# Patient Record
Sex: Female | Born: 1970
Health system: Southern US, Community
[De-identification: ages and names within clinical notes are randomized; demographics above are authoritative.]

## PROBLEM LIST (undated history)

## (undated) DIAGNOSIS — R112 Nausea with vomiting, unspecified: Secondary | ICD-10-CM

## (undated) DIAGNOSIS — K219 Gastro-esophageal reflux disease without esophagitis: Secondary | ICD-10-CM

## (undated) DIAGNOSIS — E785 Hyperlipidemia, unspecified: Secondary | ICD-10-CM

## (undated) DIAGNOSIS — Z9889 Other specified postprocedural states: Secondary | ICD-10-CM

## (undated) DIAGNOSIS — J309 Allergic rhinitis, unspecified: Secondary | ICD-10-CM

## (undated) HISTORY — DX: Hyperlipidemia, unspecified: E78.5

## (undated) HISTORY — PX: ABDOMINAL HYSTERECTOMY: SHX81

## (undated) HISTORY — DX: Gastro-esophageal reflux disease without esophagitis: K21.9

## (undated) HISTORY — DX: Allergic rhinitis, unspecified: J30.9

---

## 1993-05-18 HISTORY — PX: CYST EXCISION: SHX5701

## 2005-07-07 ENCOUNTER — Ambulatory Visit: Payer: Self-pay | Admitting: *Deleted

## 2005-07-16 ENCOUNTER — Ambulatory Visit: Payer: Self-pay | Admitting: *Deleted

## 2005-08-16 ENCOUNTER — Ambulatory Visit: Payer: Self-pay | Admitting: *Deleted

## 2007-12-14 ENCOUNTER — Ambulatory Visit: Payer: Self-pay

## 2011-05-27 ENCOUNTER — Ambulatory Visit: Payer: Self-pay | Admitting: Family Medicine

## 2013-09-28 ENCOUNTER — Telehealth: Payer: Self-pay | Admitting: Obstetrics and Gynecology

## 2013-09-28 NOTE — Telephone Encounter (Signed)
lmtcb to schedule a new patient doctor referral/[place]

## 2013-10-02 NOTE — Telephone Encounter (Signed)
I can see the patient at 10:30 on 5/20 for a new patient appointment.  I will be out of surgery at 10:30 and she can start her visit and abstracting with the nurse.

## 2013-10-02 NOTE — Telephone Encounter (Addendum)
Spoke with patient's spouse who wants to schedule the appointment but states his wife is in a lot of pain and hopes for a day sooner than 10/25/13 with Dr. Charlies Constable. This patient will probably need scheduled with Dr. Sabra Heck or Dr. Quincy Simmonds but no slots are available to offer. The patient's spouse wants [place] to see if there is anyone who can see her sooner. Please advise? This is an incoming referral for BTL and pelvic pain. I will put the records outside Dr. Elza Rafter door for review.  Cc:  Gay Filler Dr. Sabra Heck

## 2013-10-04 ENCOUNTER — Ambulatory Visit (INDEPENDENT_AMBULATORY_CARE_PROVIDER_SITE_OTHER): Payer: BC Managed Care – PPO | Admitting: Obstetrics and Gynecology

## 2013-10-04 ENCOUNTER — Encounter: Payer: Self-pay | Admitting: Obstetrics and Gynecology

## 2013-10-04 VITALS — BP 112/74 | HR 80 | Resp 16 | Ht 63.5 in | Wt 195.0 lb

## 2013-10-04 DIAGNOSIS — M25559 Pain in unspecified hip: Secondary | ICD-10-CM

## 2013-10-04 DIAGNOSIS — N920 Excessive and frequent menstruation with regular cycle: Secondary | ICD-10-CM

## 2013-10-04 DIAGNOSIS — R809 Proteinuria, unspecified: Secondary | ICD-10-CM

## 2013-10-04 DIAGNOSIS — N921 Excessive and frequent menstruation with irregular cycle: Secondary | ICD-10-CM

## 2013-10-04 HISTORY — PX: CHOLECYSTECTOMY: SHX55

## 2013-10-04 LAB — CBC WITH DIFFERENTIAL/PLATELET
BASOS PCT: 1 % (ref 0–1)
Basophils Absolute: 0.1 10*3/uL (ref 0.0–0.1)
EOS ABS: 0.2 10*3/uL (ref 0.0–0.7)
Eosinophils Relative: 2 % (ref 0–5)
HEMATOCRIT: 33.5 % — AB (ref 36.0–46.0)
HEMOGLOBIN: 11.4 g/dL — AB (ref 12.0–15.0)
LYMPHS ABS: 2.3 10*3/uL (ref 0.7–4.0)
Lymphocytes Relative: 27 % (ref 12–46)
MCH: 24.6 pg — AB (ref 26.0–34.0)
MCHC: 34 g/dL (ref 30.0–36.0)
MCV: 72.2 fL — AB (ref 78.0–100.0)
MONO ABS: 0.6 10*3/uL (ref 0.1–1.0)
MONOS PCT: 7 % (ref 3–12)
Neutro Abs: 5.4 10*3/uL (ref 1.7–7.7)
Neutrophils Relative %: 63 % (ref 43–77)
Platelets: 509 10*3/uL — ABNORMAL HIGH (ref 150–400)
RBC: 4.64 MIL/uL (ref 3.87–5.11)
RDW: 16.6 % — ABNORMAL HIGH (ref 11.5–15.5)
WBC: 8.5 10*3/uL (ref 4.0–10.5)

## 2013-10-04 LAB — POCT URINALYSIS DIPSTICK
Bilirubin, UA: NEGATIVE
Blood, UA: NEGATIVE
Glucose, UA: NEGATIVE
KETONES UA: NEGATIVE
Leukocytes, UA: NEGATIVE
Nitrite, UA: NEGATIVE
PROTEIN UA: 7
UROBILINOGEN UA: NEGATIVE
pH, UA: 5

## 2013-10-04 LAB — COMPREHENSIVE METABOLIC PANEL
ALK PHOS: 119 U/L — AB (ref 39–117)
ALT: 17 U/L (ref 0–35)
AST: 18 U/L (ref 0–37)
Albumin: 4.2 g/dL (ref 3.5–5.2)
BILIRUBIN TOTAL: 0.6 mg/dL (ref 0.2–1.2)
BUN: 11 mg/dL (ref 6–23)
CHLORIDE: 102 meq/L (ref 96–112)
CO2: 23 mEq/L (ref 19–32)
CREATININE: 0.62 mg/dL (ref 0.50–1.10)
Calcium: 9.4 mg/dL (ref 8.4–10.5)
Glucose, Bld: 90 mg/dL (ref 70–99)
Potassium: 4.2 mEq/L (ref 3.5–5.3)
Sodium: 137 mEq/L (ref 135–145)
Total Protein: 7.8 g/dL (ref 6.0–8.3)

## 2013-10-04 LAB — URINALYSIS, ROUTINE W REFLEX MICROSCOPIC
BILIRUBIN URINE: NEGATIVE
Glucose, UA: NEGATIVE mg/dL
Hgb urine dipstick: NEGATIVE
Ketones, ur: NEGATIVE mg/dL
LEUKOCYTES UA: NEGATIVE
NITRITE: NEGATIVE
PROTEIN: 30 mg/dL — AB
SPECIFIC GRAVITY, URINE: 1.015 (ref 1.005–1.030)
UROBILINOGEN UA: 0.2 mg/dL (ref 0.0–1.0)
pH: 5.5 (ref 5.0–8.0)

## 2013-10-04 LAB — HEMOGLOBIN, FINGERSTICK: HEMOGLOBIN, FINGERSTICK: 10.9 g/dL — AB (ref 12.0–16.0)

## 2013-10-04 LAB — TSH: TSH: 1.523 u[IU]/mL (ref 0.350–4.500)

## 2013-10-04 MED ORDER — TRAMADOL HCL 50 MG PO TABS: 50.0000 mg | ORAL_TABLET | Freq: Four times a day (QID) | ORAL | Status: DC | PRN

## 2013-10-04 NOTE — Progress Notes (Signed)
Patient ID: Brittany Kelly, female   DOB: July 24, 1970, 43 y.o.   MRN: [id number] GYNECOLOGY VISIT  PCP:  [facility] [place], [place])  Referring provider:  Delight Stare, MD  HPI: 43 y.o.   Married  Hispanic  female   G1P1001 with Patient's last menstrual period was 09/21/2013.   here for Pelvic pain/ heavy menstrual cycles and abnormal ultrasound. May want to discuss tubal.  Patient accompanied by husband and interpretor for history and accompanied by interpretor for physical exam.  Ultrasound on 12/16/12 showing uterus retroflexed 7.8 x 3.8 x 3.8 cm.  EMS 2.9 mm.  No focal myometrial masses.  LUS scar noted.   Right ovary with 3.0 x 1.3 x 2.5 cm ovoid mass which is adjacent to superior uterus and may be right ovary versus a pedunculated fibroid.  Left ovary 3.3 x 1.3 1.4 cm with small ovarian follicles. 2 mm hyperechoic focus within the left ovary.   Patient is having pain and pressure and heavy bleeding for 1 and 1/2 years.  Occurring in right the left lower abdomen.  Symptoms are increasing and having difficulty sleeping.  Monthly menses on birth control pills.  Tried ParaGard IUD but symptoms were worse, so she switched back to OCPs. After IUD removed, symptoms continued. Oral contraceptives have not helped and having nausea and headaches with them.   No evaluation other than ultrasound last year.   Pain worse with exercise.  Advil helps more or less.  Taking Advil 400 mg po bid and Meloxicam from husband.  Having heart burn.  Cramps and pressure start before menses and continue afterwards.  Changing a pad every 1.5 - 2 hours with her menses. Menses last 10 days and spots after this.   Declines future childbearing.  Had one Cesarean Section 18 years ago.   Had emergency laparotomy with gall bladder removal 17 years ago.   Little constipation, nothing important.  No dysuria.  No history of back injuries or spine problems.  Some pain when plays tennis.    Hgb:   10.9 Urine:  Protein 7, Ph 5  GYNECOLOGIC HISTORY: Patient's last menstrual period was 09/21/2013. Sexually active:  yes Partner preference: female Contraception:  OCP's--Aviane Menopausal hormone therapy: n/a DES exposure:  no  Blood transfusions:np   Sexually transmitted diseases:no    GYN procedures and prior surgeries:  C-Section. Last mammogram: 2013 P[facility]               Last pap and high risk HPV testing:  02/2013 wnl:?HPV testing.  History of abnormal pap smear:  no   OB History   Grav Para Term Preterm Abortions TAB SAB Ect Mult Living   1 1 1       1        LIFESTYLE: Exercise:  Tennis/gym             Tobacco:   no Alcohol:    4 glasses per week Drug use:  no  There are no active problems to display for this patient.   History reviewed. No pertinent past medical history.  Past Surgical History  Procedure Laterality Date  . Cyst excision Bilateral 1995    under both arms  . Cesarean section  1996  . Cholecystectomy      Current Outpatient Prescriptions  Medication Sig Dispense Refill  . fluticasone (FLONASE) 50 MCG/ACT nasal spray Place 1 spray into both nostrils daily.      Marland Kitchen ibuprofen (ADVIL,MOTRIN) 200 MG tablet Take 200 mg  by mouth every 6 (six) hours as needed.      Marland Kitchen levonorgestrel-ethinyl estradiol (AVIANE,ALESSE,LESSINA) 0.1-20 MG-MCG tablet Take 1 tablet by mouth daily.      . traMADol (ULTRAM) 50 MG tablet Take 1 tablet (50 mg total) by mouth every 6 (six) hours as needed.  15 tablet  0   No current facility-administered medications for this visit.     ALLERGIES: Review of patient's allergies indicates no known allergies.  Family History  Problem Relation Age of Onset  . Diabetes Mother   . Hypertension Mother   . Hypertension Father     History   Social History  . Marital Status: Married    Spouse Name: N/A    Number of Children: N/A  . Years of Education: N/A   Occupational History  . Not on  file.   Social History Main Topics  . Smoking status: Never Smoker   . Smokeless tobacco: Not on file  . Alcohol Use: 2.0 oz/week    4 drink(s) per week  . Drug Use: No  . Sexual Activity: Yes    Partners: Male    Birth Control/ Protection: OCP, Pill     Comment: Aviane 0.1/20   Other Topics Concern  . Not on file   Social History Narrative  . No narrative on file   SOC:  Married.  From [place].  ROS:  Pertinent items are noted in HPI.  PHYSICAL EXAMINATION:    BP 112/74  Pulse 80  Resp 16  Ht 5' 3.5" (1.613 m)  Wt 195 lb (88.451 kg)  BMI 34.00 kg/m2  LMP 09/21/2013   Wt Readings from Last 3 Encounters:  10/04/13 195 lb (88.451 kg)     Ht Readings from Last 3 Encounters:  10/04/13 5' 3.5" (1.613 m)    General appearance: alert, cooperative and appears stated age Head: Normocephalic, without obvious abnormality, atraumatic Neck: no adenopathy, supple, symmetrical, trachea midline and thyroid not enlarged, symmetric, no tenderness/mass/nodules Lungs: clear to auscultation bilaterally Heart: regular rate and rhythm Abdomen: RUQ oblique scar, pfannenstiel incision, soft, non-tender; no masses,  no organomegaly Extremities: extremities normal, atraumatic, no cyanosis or edema Skin: Skin color, texture, turgor normal. No rashes or lesions Lymph nodes: Cervical, supraclavicular, and axillary nodes normal. No abnormal inguinal nodes palpated Neurologic: Grossly normal  Pelvic: External genitalia:  no lesions              Urethra:  normal appearing urethra with no masses, tenderness or lesions              Bartholins and Skenes: normal                 Vagina: normal appearing vagina with normal color and discharge, no lesions              Cervix: normal appearance                 Bimanual Exam:  Uterus:  uterus is normal size, shape, consistency and nontender                                      Adnexa: normal adnexa in size, nontender and no masses  Rectovaginal: Confirms                                      Anus:  normal sphincter tone, no lesions  ASSESSMENT  Proteinuria.  Pelvic pain.  Menometrorrhagia. Declines future childbearing.   PLAN  Discussion regarding pelvic pain and menorrhagia and metrorrhagia in women.  Return for sonohysterogram and EMB. TSH, CBC with diff, CMP, urine GC/CT, urine micro and culture.  Tramadol 50 mg po q 6 hours prn.  #15.  RF zero.  Discussion of surgical intervention which may or may not include hysterectomy.  Written information about hysterectomy to patient in Spanish.   45 minutes faceto face time of which over 50^% was spent in counseling.    An After Visit Summary was printed and given to the patient.

## 2013-10-04 NOTE — Progress Notes (Signed)
Upon checkout, patient requested RX for pain.  Per Der Quincy Simmonds, will send small amount of non-narcotic medication for pain to pharm until further evaluation with PUS can be completed.  Information relayed to patient with translator.  Scheduled PUS/SHGM for 10-12-13 at 1200.  Notified on cancellation policy via translator.

## 2013-10-04 NOTE — Patient Instructions (Signed)
Informacin sobre Training and development officer (Hysterectomy Information)  La histerectoma es una ciruga que se realiza para extirpar el tero. Esta ciruga se puede realizar para tratar varios problemas mdicos. Despus de la ciruga, no volver a Personal assistant. Adems, debido a esta ciruga no podr quedar embarazada (estril). Asimismo, durante esta ciruga se podrn extirpar las trompas de falopio y los ovarios (salpingooforectoma bilateral).  RAZONES PARA REALIZAR UNA HISTERECTOMA  Sangrado persistente, anormal.  Dolor o infeccin persistente (crnico) en la pelvis.  El endometrio comienza a desarrollarse fuera del tero (endometriosis).  El endometrio comienza a desarrollarse en el msculo del tero (adenomiosis).  El tero desciende hacia la vagina (prolapso de los rganos de la pelvis).  Neoplasias benignas en el tero (fibromas uterinos) que provocan sntomas.  Clulas precancerosas.  Cncer cervical o cncer uterino. TIPOS DE HISTERECTOMA  Histerectoma supracervical: en este tipo de intervencin, se extirpa la parte superior del tero, pero no el cuello del tero.  Histerectoma total: se extirpan el tero y el cuello del tero.  Histerectoma radical: se extirpan el tero, el cuello del tero y los tejidos fibrosos que sostienen al tero en su lugar en la pelvis (parametrio). FORMAS EN QUE SE PUEDE REALIZAR UNA HISTERECTOMA  Histerectoma abdominal: se realiza un corte quirrgico grande (incisin) en el abdomen. Se extirpa el tero a travs de esta incisin.  Histerectoma vaginal: se realiza una incisin en la vagina. Se extirpa el tero a travs de esta incisin. No hay incisiones abdominales.  Histerectoma laparoscpica convencional: se realizan tres o cuatro incisiones pequeas en el abdomen. Se inserta un tubo delgado y luminoso con una cmara (laparoscopio) en una de las incisiones. A travs del resto de las incisiones se insertan otros instrumentos  quirrgicos. El tero se corta en trozos pequeos. Las piezas pequeas se eliminan a travs de las incisiones, o se retiran a travs de la vagina.  Histerectoma vaginal asistida por laparoscopia (HVAL): se realizan tres o cuatro incisiones pequeas en el abdomen. Parte de la ciruga se realiza por va laparoscpica y parte por va vaginal. El tero se extirpa a travs de la vagina.  Histerectoma laparoscpica asistida por robot: se insertan un laparoscopio y otros instrumentos quirrgicos en 3 o 4 incisiones pequeas en el abdomen. Se utiliza un dispositivo controlado por computadora para que el cirujano vea una imagen en 3D que lo ayuda a Chief Technology Officer los instrumentos quirrgicos. Esto permite movimientos ms precisos de los instrumentos quirrgicos. El tero se corta en trozos pequeos y se retira a travs de las incisiones o se elimina a travs de la vagina. RIESGOS Y Dunlap complicaciones asociadas con este procedimiento incluyen las siguientes:  Hemorragia y riesgos de Designer, industrial/product una transfusin sangunea. Infrmele al mdico si no quiere recibir ningn tipo de hemoderivados.  Cogulos de [employer] piernas o los pulmones.  Infeccin.  Lesin en los rganos circundantes.  Problemas o efectos secundarios relacionados con la anestesia.  Transformacin de cualquiera de las otras tcnicas en una histerectoma abdominal. QU ESPERAR DESPUES DE UNA HISTERECTOMA  Le darn medicamentos para el dolor.  Necesitar que alguien Nature conservation officer con usted durante los primeros 3 a 5 das despus de que regrese a Medical illustrator.  Tendr que ver al cirujano para un seguimiento 2 a 4 semanas despus de la ciruga para evaluar su progreso.  Posiblemente tenga sntomas tempranos de menopausia, como sofocos, sudoracin nocturna e insomnio.  Si le han realizado una histerectoma debido a un problema que no era cncer ni Ardelia Mems  afeccin que poda causar cncer, ya no necesitar una prueba de  Papanicolaou. Sin embargo, si ya no necesita hacerse un Papanicolau, es una buena idea hacerse un examen regularmente para asegurarse de que no hay otros problemas. Document Released: 05/04/2005 Document Revised: 02/22/2013 ExitCare Patient Information 2014 [employer].  

## 2013-10-05 DIAGNOSIS — R809 Proteinuria, unspecified: Secondary | ICD-10-CM | POA: Insufficient documentation

## 2013-10-05 DIAGNOSIS — N920 Excessive and frequent menstruation with regular cycle: Secondary | ICD-10-CM | POA: Insufficient documentation

## 2013-10-05 DIAGNOSIS — N921 Excessive and frequent menstruation with irregular cycle: Secondary | ICD-10-CM | POA: Insufficient documentation

## 2013-10-05 DIAGNOSIS — M25559 Pain in unspecified hip: Secondary | ICD-10-CM | POA: Insufficient documentation

## 2013-10-05 LAB — URINALYSIS, MICROSCOPIC ONLY
BACTERIA UA: NONE SEEN
CASTS: NONE SEEN
Crystals: NONE SEEN

## 2013-10-05 LAB — GC/CHLAMYDIA PROBE AMP, URINE
Chlamydia, Swab/Urine, PCR: NEGATIVE
GC Probe Amp, Urine: NEGATIVE

## 2013-10-06 ENCOUNTER — Telehealth: Payer: Self-pay | Admitting: Emergency Medicine

## 2013-10-06 LAB — CULTURE, URINE COMPREHENSIVE

## 2013-10-06 MED ORDER — TRAMADOL HCL 50 MG PO TABS: 50.0000 mg | ORAL_TABLET | Freq: Four times a day (QID) | ORAL | Status: DC | PRN

## 2013-10-06 NOTE — Telephone Encounter (Signed)
Husband of patient calling in and states patient has not received rx Tramadol. Advised husband that rx has been called into [place] [facility] Pharmacy: [facility] [place], [place].  He states it was not supposed to go to there and requests rx be sent to [facility] [address]. I called [facility] and cancelled rx at [facility]. Rx Escribed to [facility] and called and left message for rx on message for [facility].

## 2013-10-06 NOTE — Addendum Note (Signed)
Addended by: Michele Mcalpine on: 10/06/2013 11:58 AM   Modules accepted: Orders, Medications

## 2013-10-06 NOTE — Telephone Encounter (Signed)
Thank you for your assistance.  I will close the encounter.  

## 2013-10-12 ENCOUNTER — Other Ambulatory Visit: Payer: Self-pay | Admitting: Obstetrics and Gynecology

## 2013-10-12 ENCOUNTER — Other Ambulatory Visit: Payer: Self-pay

## 2013-10-12 ENCOUNTER — Ambulatory Visit (INDEPENDENT_AMBULATORY_CARE_PROVIDER_SITE_OTHER): Payer: BC Managed Care – PPO | Admitting: Obstetrics and Gynecology

## 2013-10-12 ENCOUNTER — Encounter: Payer: Self-pay | Admitting: Obstetrics and Gynecology

## 2013-10-12 ENCOUNTER — Ambulatory Visit (INDEPENDENT_AMBULATORY_CARE_PROVIDER_SITE_OTHER): Payer: BC Managed Care – PPO

## 2013-10-12 ENCOUNTER — Telehealth: Payer: Self-pay | Admitting: Obstetrics and Gynecology

## 2013-10-12 VITALS — BP 130/82 | HR 70 | Ht 63.5 in | Wt 194.0 lb

## 2013-10-12 DIAGNOSIS — N920 Excessive and frequent menstruation with regular cycle: Secondary | ICD-10-CM

## 2013-10-12 DIAGNOSIS — M25559 Pain in unspecified hip: Secondary | ICD-10-CM

## 2013-10-12 DIAGNOSIS — N921 Excessive and frequent menstruation with irregular cycle: Secondary | ICD-10-CM

## 2013-10-12 DIAGNOSIS — Z1231 Encounter for screening mammogram for malignant neoplasm of breast: Secondary | ICD-10-CM

## 2013-10-12 NOTE — Telephone Encounter (Signed)
Dr Quincy Simmonds would like for pt to come in for aex on next week.

## 2013-10-12 NOTE — Progress Notes (Signed)
Subjective  Patient is here for sonohysterogram for menorrhagia and pelvic pain.  Has interpretor present for the entire visit today.   Taking Tramadol prn.   Has completed childbearing and wishes for hysterectomy.   Objective  See ultrasound below  Uterus with fibroids, the largest 3.5 cm. Cesarean Section scar noted.  EMS 5.67 mm.  Ovaries normal.  Right ovary with 15 mm follicle.      Procedure - sonohysterogram Consent performed. Speculum placed in vagina. Sterile prep of cervix with betadine. Cannula placed inside endometrial cavity without difficult. Speculum removed. Sterile saline injected.     No         filling defect noted. Cannula removed. No complication.   Procedure - endometrial biopsy Consent performed. Speculum place in vagina.  Sterile prep of cervix with betadine.  Pipelle placed to    7      cm without difficulty twice. Tissue obtained and sent to pathology. Speculum removed.  No complications.  Assessment  Menorrhagia.  Fibroids.  Pelvic pain.  History of Cesarean Section.  Proteinuria.   Plan  Instructions and precautions to patient following procedure.  Follow up on EMB result.  Needs to return for routine annual exam. Repeat urinalysis at annual exam.  Will schedule mammogram.  I had a comprehensive discussion with the patient through the interpretor regarding options for care for pain and bleeding including hormonal contraception, laparoscopy with hysteroscopy and endometrial ablation, uterine artery embolization (to treat bleeding only.), and robotic total laparoscopic hysterectomy with possible bilateral salpingo-oophorectomy and cystoscopy. Risks of hysterectomy and BSO include but are not limited to bleeding, infection, damage to surrounding organs, DVT, PE, death, pneumonia, reaction to anesthesia, conversion to an open procedure, menopausal symptoms, and continued pain.  I discussed recovery expectations.   Patient would like to  proceed.  Will precert.   30 minutes face to face time of which over 50% was spent in counseling.   After visit summary to patient.

## 2013-10-12 NOTE — Progress Notes (Signed)
Patient is scheduled for bilateral screening mammogram at the [facility] on 10/24/13 at 1400. Patient agreeable to time/date/location, given printed confirmation. Advised will need spanish language interpretor for appointment.

## 2013-10-12 NOTE — Telephone Encounter (Signed)
Call from pt's husband. He accepted appt for 10-18-13 at 1 pm for pt.

## 2013-10-12 NOTE — Telephone Encounter (Signed)
Attempt to call pt. Phone just rings and rings. No answer and no VM.   (Was going to offer pt appt 10-18-13 at 1 pm for Aex with BS.)

## 2013-10-13 NOTE — Addendum Note (Signed)
Addended by: Tacy Learn, Kyrstan Gotwalt E on: 10/13/2013 10:12 AM   Modules accepted: Orders

## 2013-10-16 ENCOUNTER — Telehealth: Payer: Self-pay | Admitting: Obstetrics and Gynecology

## 2013-10-16 NOTE — Telephone Encounter (Signed)
Attempt to call pt. Phone just rings and rings. No answer and no VM. Need to go over surgery benefits.

## 2013-10-17 LAB — IPS OTHER TISSUE BIOPSY

## 2013-10-18 ENCOUNTER — Encounter: Payer: Self-pay | Admitting: Obstetrics and Gynecology

## 2013-10-18 ENCOUNTER — Ambulatory Visit (INDEPENDENT_AMBULATORY_CARE_PROVIDER_SITE_OTHER): Payer: BC Managed Care – PPO | Admitting: Obstetrics and Gynecology

## 2013-10-18 VITALS — BP 100/70 | HR 76 | Ht 63.5 in | Wt 195.0 lb

## 2013-10-18 DIAGNOSIS — Z01419 Encounter for gynecological examination (general) (routine) without abnormal findings: Secondary | ICD-10-CM

## 2013-10-18 DIAGNOSIS — Z0000 Encounter for general adult medical examination without abnormal findings: Secondary | ICD-10-CM

## 2013-10-18 LAB — POCT URINALYSIS DIPSTICK
Bilirubin, UA: NEGATIVE
GLUCOSE UA: NEGATIVE
Ketones, UA: NEGATIVE
LEUKOCYTES UA: NEGATIVE
Nitrite, UA: NEGATIVE
UROBILINOGEN UA: NEGATIVE
pH, UA: 5

## 2013-10-18 MED ORDER — LEVONORGESTREL-ETHINYL ESTRAD 0.1-20 MG-MCG PO TABS: 1.0000 | ORAL_TABLET | Freq: Every day | ORAL | Status: DC

## 2013-10-18 NOTE — Patient Instructions (Signed)

## 2013-10-18 NOTE — Progress Notes (Signed)
Patient ID: Brittany Kelly, female   DOB: 06/17/1970, 43 y.o.   MRN: [id number] GYNECOLOGY VISIT  PCP:   Delight Stare, MD  Referring provider:   HPI: 43 y.o.   Married  Hispanic  female   G1P1001 with Patient's last menstrual period was 10/14/2013.   here for   AEX. Having menorrhagia and pelvic pain.  Patient wants robotic hysterectomy.  Watched the DVD. EMB.on 10/13/13 - disordered proliferative endometrium with focal secretory type change - negative for atypia and malignancy. Ultrasound on 10/12/13 showed fibroids and normal ovaries.   Is having vaginal bleeding but it is less. Not taking birth control pills.  Stopped on May 30th.  Ran out.   Hgb:    11.4 on 10-04-13 Urine:   Mod. RBC's (see LMP), Trace Protein  GYNECOLOGIC HISTORY: Patient's last menstrual period was 10/14/2013. Sexually active:  yes Partner preference: female Contraception:   OCP's--Aviane Menopausal hormone therapy: n/a DES exposure:  no  Blood transfusions:  no Sexually transmitted diseases:   no GYN procedures and prior surgeries:  C-sectioon Last mammogram:  2013 wnl: [facility].  Has appointment for next week.              Last pap and high risk HPV testing: 02/2013 wnl: ?HPV testing. History of abnormal pap smear:  no   OB History   Grav Para Term Preterm Abortions TAB SAB Ect Mult Living   1 1 1       1        LIFESTYLE: Exercise:    Tennis/gym           Tobacco:    no Alcohol:      4 glasses per week Drug use:   no  OTHER HEALTH MAINTENANCE: Tetanus/TDap:  Up to date with PCP Gardisil:               n/a Influenza:             never Zostavax:              n/a  Bone density:        n/a Colonoscopy:        n/a  Cholesterol check:   2014 wnl  Family History  Problem Relation Age of Onset  . Diabetes Mother   . Hypertension Mother   . Hypertension Father     Patient Active Problem List   Diagnosis Date Noted  . Proteinuria 10/05/2013  . Menorrhagia 10/05/2013  .  Metrorrhagia 10/05/2013  . Pain in joint, pelvic region and thigh 10/05/2013   No past medical history on file.  Past Surgical History  Procedure Laterality Date  . Cyst excision Bilateral 1995    under both arms  . Cesarean section  1996  . Cholecystectomy      ALLERGIES: Review of patient's allergies indicates no known allergies.  Current Outpatient Prescriptions  Medication Sig Dispense Refill  . fluticasone (FLONASE) 50 MCG/ACT nasal spray Place 1 spray into both nostrils daily.      Marland Kitchen ibuprofen (ADVIL,MOTRIN) 200 MG tablet Take 200 mg by mouth every 6 (six) hours as needed.      Marland Kitchen levonorgestrel-ethinyl estradiol (AVIANE,ALESSE,LESSINA) 0.1-20 MG-MCG tablet Take 1 tablet by mouth daily.      . traMADol (ULTRAM) 50 MG tablet Take 1 tablet (50 mg total) by mouth every 6 (six) hours as needed.  15 tablet  0   No current facility-administered medications for this visit.     ROS:  Pertinent  items are noted in HPI.  SOCIAL HISTORY:  Married.  From [place].   PHYSICAL EXAMINATION:    BP 100/70  Pulse 76  Ht 5' 3.5" (1.613 m)  Wt 195 lb (88.451 kg)  BMI 34.00 kg/m2  LMP 10/14/2013   Wt Readings from Last 3 Encounters:  10/18/13 195 lb (88.451 kg)  10/12/13 194 lb (87.998 kg)  10/04/13 195 lb (88.451 kg)     Ht Readings from Last 3 Encounters:  10/18/13 5' 3.5" (1.613 m)  10/12/13 5' 3.5" (1.613 m)  10/04/13 5' 3.5" (1.613 m)    General appearance: alert, cooperative and appears stated age Head: Normocephalic, without obvious abnormality, atraumatic Neck: no adenopathy, supple, symmetrical, trachea midline and thyroid not enlarged, symmetric, no tenderness/mass/nodules Lungs: clear to auscultation bilaterally Breasts: Inspection negative, No nipple retraction or dimpling, No nipple discharge or bleeding, No axillary or supraclavicular adenopathy, Normal to palpation without dominant masses Heart: regular rate and rhythm Abdomen: soft, non-tender; no masses,  no  organomegaly Extremities: extremities normal, atraumatic, no cyanosis or edema Skin: Skin color, texture, turgor normal. No rashes or lesions Lymph nodes: Cervical, supraclavicular, and axillary nodes normal. No abnormal inguinal nodes palpated Neurologic: Grossly normal  Pelvic: External genitalia:  no lesions              Urethra:  normal appearing urethra with no masses, tenderness or lesions              Bartholins and Skenes: normal                 Vagina: normal appearing vagina with normal color and discharge, no lesions              Cervix: normal appearance.  Vaginal blood noted.               Pap and high risk HPV testing done: yes.            Bimanual Exam:  Uterus:  uterus is normal size, shape, consistency and nontender                                      Adnexa: normal adnexa in size, nontender and no masses                                      Rectovaginal: Confirms                                      Anus:  normal sphincter tone, no lesions  ASSESSMENT  Normal gynecologic exam. Menorrhagia.  Pelvic pain.   PLAN  Mammogram recommended yearly.  Pap smear and high risk HPV testing performed. Continue with LoEstrin - restart today.  Rx for 3 months.   See orders.  Counseled on self breast exam. Proceed with robotic total laparoscopic hysterectomy with BSO.  Return annually or prn   An After Visit Summary was printed and given to the patient.

## 2013-10-20 LAB — IPS PAP TEST WITH HPV

## 2013-10-23 NOTE — Telephone Encounter (Signed)
Release on file to speak with spouse. Advised spouse that per benefit quote received, patient would be responsible for $2881.36 for surgeons portion of surgery. Explained that facility will bill separately and will them directly. They are ready to schedule surgery. Prefers 07/07. They will wait to hear from nurse regarding scheduling.

## 2013-10-23 NOTE — Telephone Encounter (Signed)
Case request for 11-20-13 sent to hospital.

## 2013-10-24 ENCOUNTER — Ambulatory Visit
Admission: RE | Admit: 2013-10-24 | Discharge: 2013-10-24 | Disposition: A | Discharge status: Home / Self Care | Payer: BC Managed Care – PPO | Source: Ambulatory Visit

## 2013-10-24 DIAGNOSIS — Z1231 Encounter for screening mammogram for malignant neoplasm of breast: Secondary | ICD-10-CM

## 2013-10-26 NOTE — Telephone Encounter (Signed)
Spouse called to confirm surgery scheduled date. Advised that per Gay Filler, surgery is scheduled for 07.07.2015, but the time is still being figured out. She will contact patient with all details once finalized. Payment made of $2881.36 for surgeons fees. Mailed receipt.

## 2013-11-02 NOTE — Telephone Encounter (Signed)
I have reviewed the notes regarding the patient's instructions.  I will close the encounter.

## 2013-11-02 NOTE — Telephone Encounter (Signed)
Spoke with Marchia Bond patients husband (DPR signed). Surgery information & time given. He is aware the hospital will call & give them the time they need to be at the hospital. Told only clear fluids the day before the surgery & nothing including water after midnight. No jewelry or piercings on patient also. Instructions of what medication not to take as well as the bowel prep that is needed. Husband states he understands all instructions. Pre-op appt scheduled for Monday 11-06-13 at 12pm. Instead of mailing surgical information form,it can be given to pt at appt on Monday. Husband aware that pt will need to schedule a post op appt while they are here for the pre-op.  Close encounter when complete

## 2013-11-06 ENCOUNTER — Ambulatory Visit (INDEPENDENT_AMBULATORY_CARE_PROVIDER_SITE_OTHER): Payer: BC Managed Care – PPO | Admitting: Obstetrics and Gynecology

## 2013-11-06 ENCOUNTER — Encounter: Payer: Self-pay | Admitting: Obstetrics and Gynecology

## 2013-11-06 VITALS — BP 136/80 | HR 76 | Resp 18 | Ht 63.5 in | Wt 196.0 lb

## 2013-11-06 DIAGNOSIS — N921 Excessive and frequent menstruation with irregular cycle: Secondary | ICD-10-CM

## 2013-11-06 DIAGNOSIS — M25559 Pain in unspecified hip: Secondary | ICD-10-CM

## 2013-11-06 DIAGNOSIS — N920 Excessive and frequent menstruation with regular cycle: Secondary | ICD-10-CM

## 2013-11-06 NOTE — Progress Notes (Signed)
GYNECOLOGY VISIT  PCP:    Referring provider:   HPI: 43 y.o.   Married  Hispanic  female   G1P1001 with Patient's last menstrual period was 10/14/2013.   here for surgical consultation. Husband present for the discussion portion of the visit - history and surgical plan discussion.   Having menorrhagia and pelvic pain.  Wishes to proceed with robotic total laparoscopic hysterectomy with bilateral salpingo-oophorectomy.  Declines future childbearing and would like to take estrogen therapy post op.  EMB.on 10/13/13 - disordered proliferative endometrium with focal secretory type change - negative for atypia and malignancy.  Ultrasound on 10/12/13 showed fibroids and normal ovaries.   Currently on OCPs which have not significantly helped her pain or bleeding.   Headache for 3 days.  Some fever and sinus congestion? History of sinusitis but does not feel this is her usual sinusitis presentation.   GYNECOLOGIC HISTORY: Patient's last menstrual period was 10/14/2013. Sexually active: yes  Partner preference: female  Contraception: OCP's--Aviane  Menopausal hormone therapy: n/a  DES exposure: no  Blood transfusions: no  Sexually transmitted diseases: no  GYN procedures and prior surgeries: C-sectioon  Last mammogram: 6/0/15 - BI-RADS 1.  Last pap and high risk HPV testing: 10/18/13- no intraepithelial lesion or malignancy, negative HR HPV. History of abnormal pap smear: no    OB History   Grav Para Term Preterm Abortions TAB SAB Ect Mult Living   1 1 1       1        Patient Active Problem List   Diagnosis Date Noted  . Proteinuria 10/05/2013  . Menorrhagia 10/05/2013  . Metrorrhagia 10/05/2013  . Pain in joint, pelvic region and thigh 10/05/2013    History reviewed. No pertinent past medical history.  Past Surgical History  Procedure Laterality Date  . Cyst excision Bilateral 1995    under both arms  . Cesarean section  1996  . Cholecystectomy      Current Outpatient  Prescriptions  Medication Sig Dispense Refill  . ibuprofen (ADVIL,MOTRIN) 200 MG tablet Take 200 mg by mouth every 6 (six) hours as needed.      Marland Kitchen levonorgestrel-ethinyl estradiol (AVIANE,ALESSE,LESSINA) 0.1-20 MG-MCG tablet Take 1 tablet by mouth daily.  1 Package  2  . fluticasone (FLONASE) 50 MCG/ACT nasal spray Place 1 spray into both nostrils daily.      . traMADol (ULTRAM) 50 MG tablet Take 1 tablet (50 mg total) by mouth every 6 (six) hours as needed.  15 tablet  0   No current facility-administered medications for this visit.     ALLERGIES: Review of patient's allergies indicates no known allergies.  Family History  Problem Relation Age of Onset  . Diabetes Mother   . Hypertension Mother   . Hypertension Father     History   Social History  . Marital Status: Married    Spouse Name: N/A    Number of Children: N/A  . Years of Education: N/A   Occupational History  . Not on file.   Social History Main Topics  . Smoking status: Never Smoker   . Smokeless tobacco: Not on file  . Alcohol Use: 2.0 oz/week    4 drink(s) per week  . Drug Use: No  . Sexual Activity: Yes    Partners: Male    Birth Control/ Protection: OCP, Pill     Comment: Aviane 0.1/20   Other Topics Concern  . Not on file   Social History Narrative  . No narrative on  file    ROS:  Pertinent items are noted in HPI.  PHYSICAL EXAMINATION:    BP 136/80  Pulse 76  Resp 18  Ht 5' 3.5" (1.613 m)  Wt 196 lb (88.905 kg)  BMI 34.17 kg/m2  LMP 10/14/2013   Wt Readings from Last 3 Encounters:  11/06/13 196 lb (88.905 kg)  10/18/13 195 lb (88.451 kg)  10/12/13 194 lb (87.998 kg)     Ht Readings from Last 3 Encounters:  11/06/13 5' 3.5" (1.613 m)  10/18/13 5' 3.5" (1.613 m)  10/12/13 5' 3.5" (1.613 m)    General appearance: alert, cooperative and appears stated age Head: Normocephalic, without obvious abnormality, atraumatic, minimal sinus tenderness.  Neck: no adenopathy, supple,  symmetrical, trachea midline and thyroid not enlarged, symmetric, no tenderness/mass/nodules Lungs: clear to auscultation bilaterally Heart: regular rate and rhythm Abdomen: right oblique scar and pfannenstiel incisions, soft, non-tender; no masses,  no organomegaly Extremities: extremities normal, atraumatic, no cyanosis or edema Skin: Skin color, texture, turgor normal. No rashes or lesions Lymph nodes: Cervical, supraclavicular, and axillary nodes normal. No abnormal inguinal nodes palpated Neurologic: Grossly normal  Pelvic: External genitalia:  no lesions              Urethra:  normal appearing urethra with no masses, tenderness or lesions              Bartholins and Skenes: normal                 Vagina: normal appearing vagina with normal color and discharge, no lesions              Cervix: normal appearance                 Bimanual Exam:  Uterus:  uterus is normal size, shape, consistency and nontender                                      Adnexa: normal adnexa in size, nontender and no masses                               ASSESSMENT  Menorrhagia.  Fibroids.  Pelvic pain.  History of Cesarean Section  PLAN  Proceed with robotic total laparoscopic hysterectomy with bilateral salpingo-oophorectomy and anticipated lysis of adhesions.  Risks, benefits and alternatives reviewed with the patient who wishes to proceed.  Patient understands that hysterectomy is not a guarantee she will have resolution of her pain. Patient accepts estrogen therapy post op.  Patient understands possible risk of conversion to laparotomy for her surgery.  An After Visit Summary was printed and given to the patient.  25 minutes face to face time of which over 50% was spent in counseling.

## 2013-11-06 NOTE — Progress Notes (Signed)
Spoke to patient and husband after visit with Dr Quincy Simmonds. Surgery instruction sheet reviewed and given to patient, copy on chart. Post op appointments scheduled.

## 2013-11-14 ENCOUNTER — Encounter (HOSPITAL_COMMUNITY): Payer: Self-pay

## 2013-11-14 ENCOUNTER — Encounter (HOSPITAL_COMMUNITY)
Admission: RE | Admit: 2013-11-14 | Discharge: 2013-11-14 | Disposition: A | Discharge status: Home / Self Care | Payer: BC Managed Care – PPO | Source: Ambulatory Visit | Attending: Obstetrics and Gynecology | Admitting: Obstetrics and Gynecology

## 2013-11-14 DIAGNOSIS — Z01812 Encounter for preprocedural laboratory examination: Secondary | ICD-10-CM | POA: Insufficient documentation

## 2013-11-14 HISTORY — DX: Nausea with vomiting, unspecified: R11.2

## 2013-11-14 HISTORY — DX: Nausea with vomiting, unspecified: Z98.890

## 2013-11-14 LAB — CBC
HCT: 37.8 % (ref 36.0–46.0)
Hemoglobin: 12.6 g/dL (ref 12.0–15.0)
MCH: 24.3 pg — ABNORMAL LOW (ref 26.0–34.0)
MCHC: 33.3 g/dL (ref 30.0–36.0)
MCV: 72.8 fL — AB (ref 78.0–100.0)
PLATELETS: 462 10*3/uL — AB (ref 150–400)
RBC: 5.19 MIL/uL — ABNORMAL HIGH (ref 3.87–5.11)
RDW: 15.9 % — ABNORMAL HIGH (ref 11.5–15.5)
WBC: 7.6 10*3/uL (ref 4.0–10.5)

## 2013-11-14 NOTE — Patient Instructions (Signed)
[facility]  11/14/2013   Your procedure is scheduled on:  11/21/13  Enter through the Main Entrance of [facility] at [place] up the phone at the desk and dial [phone].   Call this number if you have problems the morning of surgery: [phone]   Remember:   Do not eat food:After Midnight.  Do not drink clear liquids: After Midnight.  Take these medicines the morning of surgery with A SIP OF WATER: NA   Do not wear jewelry, make-up or nail polish.  Do not wear lotions, powders, or perfumes. You may wear deodorant.  Do not shave 48 hours prior to surgery.  Do not bring valuables to the hospital.  [facility] is not   responsible for any belongings or valuables brought to the hospital.  Contacts, dentures or bridgework may not be worn into surgery.  Leave suitcase in the car. After surgery it may be brought to your room.  For patients admitted to the hospital, checkout time is 11:00 AM the day of              discharge.   Patients discharged the day of surgery will not be allowed to drive             home.  Name and phone number of your driver: NA  Special Instructions:      Please read over the following fact sheets that you were given:   Surgical Site Infection Prevention

## 2013-11-15 ENCOUNTER — Telehealth: Payer: Self-pay

## 2013-11-15 NOTE — Telephone Encounter (Signed)
Informed pt husband due to her not hearing good that her surgery time has changed to 7:30 and arrive at 6:00. He voiced understanding and agreed  Encounter closed

## 2013-11-20 NOTE — H&P (Signed)
Brittany E Amundson de Berton Lan, MD at 11/06/2013 12:37 PM      Status: Signed            GYNECOLOGY VISIT   PCP:     Referring provider:    HPI: 43 y.o.   Married  Hispanic  female    G1P1001 with Patient's last menstrual period was 10/14/2013.    here for surgical consultation. Husband present for the discussion portion of the visit - history and surgical plan discussion.    Having menorrhagia and pelvic pain.   Wishes to proceed with robotic total laparoscopic hysterectomy with bilateral salpingo-oophorectomy.   Declines future childbearing and would like to take estrogen therapy post op.   EMB.on 10/13/13 - disordered proliferative endometrium with focal secretory type change - negative for atypia and malignancy.   Ultrasound on 10/12/13 showed fibroids and normal ovaries.    Currently on OCPs which have not significantly helped her pain or bleeding.    Headache for 3 days.   Some fever and sinus congestion? History of sinusitis but does not feel this is her usual sinusitis presentation.    GYNECOLOGIC HISTORY: Patient's last menstrual period was 10/14/2013. Sexually active: yes   Partner preference: female   Contraception: OCP's--Aviane   Menopausal hormone therapy: n/a   DES exposure: no   Blood transfusions: no   Sexually transmitted diseases: no   GYN procedures and prior surgeries: C-sectioon   Last mammogram: 6/0/15 - BI-RADS 1.   Last pap and high risk HPV testing: 10/18/13- no intraepithelial lesion or malignancy, negative HR HPV. History of abnormal pap smear: no      OB History     Grav  Para  Term  Preterm  Abortions  TAB  SAB  Ect  Mult  Living     1  1  1              1             Patient Active Problem List     Diagnosis  Date Noted   .  Proteinuria  10/05/2013   .  Menorrhagia  10/05/2013   .  Metrorrhagia  10/05/2013   .  Pain in joint, pelvic region and thigh  10/05/2013        History reviewed. No pertinent past medical  history.    Past Surgical History   Procedure  Laterality  Date   .  Cyst excision  Bilateral  1995       under both arms   .  Cesarean section    1996   .  Cholecystectomy             Current Outpatient Prescriptions   Medication  Sig  Dispense  Refill   .  ibuprofen (ADVIL,MOTRIN) 200 MG tablet  Take 200 mg by mouth every 6 (six) hours as needed.         Marland Kitchen  levonorgestrel-ethinyl estradiol (AVIANE,ALESSE,LESSINA) 0.1-20 MG-MCG tablet  Take 1 tablet by mouth daily.   1 Package   2   .  fluticasone (FLONASE) 50 MCG/ACT nasal spray  Place 1 spray into both nostrils daily.         .  traMADol (ULTRAM) 50 MG tablet  Take 1 tablet (50 mg total) by mouth every 6 (six) hours as needed.   15 tablet   0       No current facility-administered medications for this visit.  ALLERGIES: Review of patient's allergies indicates no known allergies.    Family History   Problem  Relation  Age of Onset   .  Diabetes  Mother     .  Hypertension  Mother     .  Hypertension  Father           History       Social History   .  Marital Status:  Married       Spouse Name:  N/A       Number of Children:  N/A   .  Years of Education:  N/A       Occupational History   .  Not on file.       Social History Main Topics   .  Smoking status:  Never Smoker    .  Smokeless tobacco:  Not on file   .  Alcohol Use:  2.0 oz/week       4 drink(s) per week   .  Drug Use:  No   .  Sexual Activity:  Yes       Partners:  Male       Birth Control/ Protection:  OCP, Pill         Comment: Aviane 0.1/20       Other Topics  Concern   .  Not on file       Social History Narrative   .  No narrative on file        ROS:  Pertinent items are noted in HPI.   PHYSICAL EXAMINATION:     BP 136/80  Pulse 76  Resp 18  Ht 5' 3.5" (1.613 m)  Wt 196 lb (88.905 kg)  BMI 34.17 kg/m2  LMP 10/14/2013    Wt Readings from Last 3 Encounters:   11/06/13  196 lb (88.905 kg)   10/18/13  195 lb  (88.451 kg)   10/12/13  194 lb (87.998 kg)        Ht Readings from Last 3 Encounters:   11/06/13  5' 3.5" (1.613 m)   10/18/13  5' 3.5" (1.613 m)   10/12/13  5' 3.5" (1.613 m)        General appearance: alert, cooperative and appears stated age Head: Normocephalic, without obvious abnormality, atraumatic, minimal sinus tenderness.   Neck: no adenopathy, supple, symmetrical, trachea midline and thyroid not enlarged, symmetric, no tenderness/mass/nodules Lungs: clear to auscultation bilaterally Heart: regular rate and rhythm Abdomen: right oblique scar and pfannenstiel incisions, soft, non-tender; no masses,  no organomegaly Extremities: extremities normal, atraumatic, no cyanosis or edema Skin: Skin color, texture, turgor normal. No rashes or lesions Lymph nodes: Cervical, supraclavicular, and axillary nodes normal. No abnormal inguinal nodes palpated Neurologic: Grossly normal   Pelvic: External genitalia:  no lesions              Urethra:  normal appearing urethra with no masses, tenderness or lesions              Bartholins and Skenes: normal                  Vagina: normal appearing vagina with normal color and discharge, no lesions              Cervix: normal appearance                  Bimanual Exam:  Uterus:  uterus is normal size, shape, consistency and nontender  Adnexa: normal adnexa in size, nontender and no masses                                ASSESSMENT   Menorrhagia.   Fibroids.   Pelvic pain.   History of Cesarean Section   PLAN   Proceed with robotic total laparoscopic hysterectomy with bilateral salpingo-oophorectomy and anticipated lysis of adhesions.   Risks, benefits and alternatives reviewed with the patient who wishes to proceed.   Patient understands that hysterectomy is not a guarantee she will have resolution of her pain. Patient accepts estrogen therapy post op.   Patient understands possible risk of  conversion to laparotomy for her surgery.   An After Visit Summary was printed and given to the patient.   25 minutes face to face time of which over 50% was spent in counseling.

## 2013-11-21 ENCOUNTER — Observation Stay (HOSPITAL_COMMUNITY)
Admission: RE | Admit: 2013-11-21 | Discharge: 2013-11-22 | Disposition: A | Discharge status: Home / Self Care | Payer: BC Managed Care – PPO | Source: Ambulatory Visit | Attending: Obstetrics and Gynecology | Admitting: Obstetrics and Gynecology

## 2013-11-21 ENCOUNTER — Ambulatory Visit (HOSPITAL_COMMUNITY): Payer: BC Managed Care – PPO | Admitting: Anesthesiology

## 2013-11-21 ENCOUNTER — Encounter (HOSPITAL_COMMUNITY): Payer: Self-pay | Admitting: Certified Registered"

## 2013-11-21 ENCOUNTER — Encounter (HOSPITAL_COMMUNITY)
Admission: RE | Disposition: A | Discharge status: Home / Self Care | Payer: Self-pay | Source: Ambulatory Visit | Attending: Obstetrics and Gynecology

## 2013-11-21 ENCOUNTER — Encounter (HOSPITAL_COMMUNITY): Payer: BC Managed Care – PPO | Admitting: Anesthesiology

## 2013-11-21 DIAGNOSIS — N8000 Endometriosis of the uterus, unspecified: Secondary | ICD-10-CM | POA: Insufficient documentation

## 2013-11-21 DIAGNOSIS — Z90710 Acquired absence of both cervix and uterus: Secondary | ICD-10-CM | POA: Diagnosis present

## 2013-11-21 DIAGNOSIS — D649 Anemia, unspecified: Secondary | ICD-10-CM | POA: Insufficient documentation

## 2013-11-21 DIAGNOSIS — N9489 Other specified conditions associated with female genital organs and menstrual cycle: Secondary | ICD-10-CM

## 2013-11-21 DIAGNOSIS — N949 Unspecified condition associated with female genital organs and menstrual cycle: Secondary | ICD-10-CM | POA: Insufficient documentation

## 2013-11-21 DIAGNOSIS — N946 Dysmenorrhea, unspecified: Secondary | ICD-10-CM

## 2013-11-21 DIAGNOSIS — N838 Other noninflammatory disorders of ovary, fallopian tube and broad ligament: Secondary | ICD-10-CM | POA: Insufficient documentation

## 2013-11-21 DIAGNOSIS — D251 Intramural leiomyoma of uterus: Secondary | ICD-10-CM | POA: Insufficient documentation

## 2013-11-21 DIAGNOSIS — N808 Other endometriosis: Secondary | ICD-10-CM

## 2013-11-21 DIAGNOSIS — Z833 Family history of diabetes mellitus: Secondary | ICD-10-CM | POA: Insufficient documentation

## 2013-11-21 DIAGNOSIS — D259 Leiomyoma of uterus, unspecified: Secondary | ICD-10-CM

## 2013-11-21 DIAGNOSIS — Z8249 Family history of ischemic heart disease and other diseases of the circulatory system: Secondary | ICD-10-CM | POA: Insufficient documentation

## 2013-11-21 DIAGNOSIS — N920 Excessive and frequent menstruation with regular cycle: Principal | ICD-10-CM | POA: Insufficient documentation

## 2013-11-21 HISTORY — PX: CYSTOSCOPY: SHX5120

## 2013-11-21 HISTORY — PX: ROBOTIC ASSISTED TOTAL HYSTERECTOMY WITH BILATERAL SALPINGO OOPHERECTOMY: SHX6086

## 2013-11-21 LAB — PREGNANCY, URINE: PREG TEST UR: NEGATIVE

## 2013-11-21 SURGERY — ROBOTIC ASSISTED TOTAL HYSTERECTOMY WITH BILATERAL SALPINGO OOPHORECTOMY
Anesthesia: General | Site: Urethra

## 2013-11-21 MED ORDER — DIPHENHYDRAMINE HCL 50 MG/ML IJ SOLN: 12.5000 mg | Freq: Four times a day (QID) | INTRAMUSCULAR | Status: DC | PRN

## 2013-11-21 MED ORDER — SCOPOLAMINE 1 MG/3DAYS TD PT72
MEDICATED_PATCH | TRANSDERMAL | Status: AC
  Administered 2013-11-21: 1.5 mg via TRANSDERMAL
  Filled 2013-11-21: qty 1

## 2013-11-21 MED ORDER — GLYCOPYRROLATE 0.2 MG/ML IJ SOLN
INTRAMUSCULAR | Status: AC
  Filled 2013-11-21: qty 3

## 2013-11-21 MED ORDER — SODIUM CHLORIDE 0.9 % IJ SOLN
INTRAMUSCULAR | Status: AC
  Filled 2013-11-21: qty 10

## 2013-11-21 MED ORDER — LACTATED RINGERS IV SOLN
INTRAVENOUS | Status: DC
Start: 2013-11-21 — End: 2013-11-21

## 2013-11-21 MED ORDER — BUPIVACAINE HCL (PF) 0.25 % IJ SOLN
INTRAMUSCULAR | Status: DC | PRN
  Administered 2013-11-21: 15 mL

## 2013-11-21 MED ORDER — FENTANYL CITRATE 0.05 MG/ML IJ SOLN
INTRAMUSCULAR | Status: AC
  Filled 2013-11-21: qty 5

## 2013-11-21 MED ORDER — PROMETHAZINE HCL 25 MG/ML IJ SOLN: 6.2500 mg | INTRAMUSCULAR | Status: DC | PRN

## 2013-11-21 MED ORDER — PROPOFOL 10 MG/ML IV EMUL
INTRAVENOUS | Status: AC
  Filled 2013-11-21: qty 20

## 2013-11-21 MED ORDER — SODIUM CHLORIDE 0.9 % IJ SOLN
INTRAMUSCULAR | Status: AC
  Filled 2013-11-21: qty 50

## 2013-11-21 MED ORDER — MEPERIDINE HCL 25 MG/ML IJ SOLN: 6.2500 mg | INTRAMUSCULAR | Status: DC | PRN

## 2013-11-21 MED ORDER — PHENYLEPHRINE HCL 10 MG/ML IJ SOLN
INTRAMUSCULAR | Status: DC | PRN
  Administered 2013-11-21 (×2): 40 ug via INTRAVENOUS
  Administered 2013-11-21: 80 ug via INTRAVENOUS
  Administered 2013-11-21: 40 ug via INTRAVENOUS

## 2013-11-21 MED ORDER — MIDAZOLAM HCL 2 MG/2ML IJ SOLN
INTRAMUSCULAR | Status: AC
  Filled 2013-11-21: qty 2

## 2013-11-21 MED ORDER — LACTATED RINGERS IV SOLN
INTRAVENOUS | Status: DC
  Administered 2013-11-21 – 2013-11-22 (×2): via INTRAVENOUS

## 2013-11-21 MED ORDER — LACTATED RINGERS IR SOLN
Status: DC | PRN
  Administered 2013-11-21: 3000 mL

## 2013-11-21 MED ORDER — DOCUSATE SODIUM 100 MG PO CAPS
100.0000 mg | ORAL_CAPSULE | Freq: Two times a day (BID) | ORAL | Status: DC
  Administered 2013-11-21 – 2013-11-22 (×2): 100 mg via ORAL
  Filled 2013-11-21 (×2): qty 1

## 2013-11-21 MED ORDER — KETOROLAC TROMETHAMINE 30 MG/ML IJ SOLN
INTRAMUSCULAR | Status: DC | PRN
  Administered 2013-11-21: 30 mg via INTRAVENOUS

## 2013-11-21 MED ORDER — ONDANSETRON HCL 4 MG PO TABS: 4.0000 mg | ORAL_TABLET | Freq: Four times a day (QID) | ORAL | Status: DC | PRN

## 2013-11-21 MED ORDER — LACTATED RINGERS IV SOLN
INTRAVENOUS | Status: DC
Start: 2013-11-21 — End: 2013-11-21
  Administered 2013-11-21: 10:00:00 via INTRAVENOUS
  Administered 2013-11-21: 1000 mL via INTRAVENOUS

## 2013-11-21 MED ORDER — NEOSTIGMINE METHYLSULFATE 10 MG/10ML IV SOLN
INTRAVENOUS | Status: AC
  Filled 2013-11-21: qty 1

## 2013-11-21 MED ORDER — ROCURONIUM BROMIDE 100 MG/10ML IV SOLN
INTRAVENOUS | Status: DC | PRN
  Administered 2013-11-21: 20 mg via INTRAVENOUS
  Administered 2013-11-21: 50 mg via INTRAVENOUS
  Administered 2013-11-21: 10 mg via INTRAVENOUS

## 2013-11-21 MED ORDER — ROPIVACAINE HCL 5 MG/ML IJ SOLN
INTRAMUSCULAR | Status: DC | PRN
  Administered 2013-11-21: 60 mL

## 2013-11-21 MED ORDER — FENTANYL CITRATE 0.05 MG/ML IJ SOLN: 25.0000 ug | INTRAMUSCULAR | Status: DC | PRN

## 2013-11-21 MED ORDER — GLYCOPYRROLATE 0.2 MG/ML IJ SOLN
INTRAMUSCULAR | Status: DC | PRN
  Administered 2013-11-21: .5 mg via INTRAVENOUS

## 2013-11-21 MED ORDER — IBUPROFEN 600 MG PO TABS
600.0000 mg | ORAL_TABLET | Freq: Four times a day (QID) | ORAL | Status: DC | PRN
  Administered 2013-11-22: 600 mg via ORAL
  Filled 2013-11-21: qty 1

## 2013-11-21 MED ORDER — DEXAMETHASONE SODIUM PHOSPHATE 10 MG/ML IJ SOLN
INTRAMUSCULAR | Status: AC
  Filled 2013-11-21: qty 1

## 2013-11-21 MED ORDER — PROPOFOL 10 MG/ML IV BOLUS
INTRAVENOUS | Status: DC | PRN
  Administered 2013-11-21: 180 mg via INTRAVENOUS

## 2013-11-21 MED ORDER — DIPHENHYDRAMINE HCL 12.5 MG/5ML PO ELIX: 12.5000 mg | ORAL_SOLUTION | Freq: Four times a day (QID) | ORAL | Status: DC | PRN

## 2013-11-21 MED ORDER — KETOROLAC TROMETHAMINE 30 MG/ML IJ SOLN: 15.0000 mg | Freq: Once | INTRAMUSCULAR | Status: DC | PRN

## 2013-11-21 MED ORDER — HYDROMORPHONE HCL PF 1 MG/ML IJ SOLN
INTRAMUSCULAR | Status: DC | PRN
  Administered 2013-11-21: 1 mg via INTRAVENOUS

## 2013-11-21 MED ORDER — ROPIVACAINE HCL 5 MG/ML IJ SOLN
INTRAMUSCULAR | Status: AC
  Filled 2013-11-21: qty 60

## 2013-11-21 MED ORDER — NALOXONE HCL 0.4 MG/ML IJ SOLN: 0.4000 mg | INTRAMUSCULAR | Status: DC | PRN

## 2013-11-21 MED ORDER — BUPIVACAINE HCL (PF) 0.25 % IJ SOLN
INTRAMUSCULAR | Status: AC
  Filled 2013-11-21: qty 30

## 2013-11-21 MED ORDER — ONDANSETRON HCL 4 MG/2ML IJ SOLN
INTRAMUSCULAR | Status: AC
  Filled 2013-11-21: qty 2

## 2013-11-21 MED ORDER — LIDOCAINE HCL (CARDIAC) 20 MG/ML IV SOLN
INTRAVENOUS | Status: DC | PRN
  Administered 2013-11-21: 80 mg via INTRAVENOUS

## 2013-11-21 MED ORDER — KETOROLAC TROMETHAMINE 30 MG/ML IJ SOLN
30.0000 mg | Freq: Four times a day (QID) | INTRAMUSCULAR | Status: AC
  Administered 2013-11-21 – 2013-11-22 (×3): 30 mg via INTRAVENOUS
  Filled 2013-11-21 (×3): qty 1

## 2013-11-21 MED ORDER — FENTANYL CITRATE 0.05 MG/ML IJ SOLN
INTRAMUSCULAR | Status: DC | PRN
  Administered 2013-11-21 (×5): 50 ug via INTRAVENOUS

## 2013-11-21 MED ORDER — DEXTROSE 5 % IV SOLN
2.0000 g | INTRAVENOUS | Status: AC
  Administered 2013-11-21: 2 g via INTRAVENOUS
  Filled 2013-11-21: qty 2

## 2013-11-21 MED ORDER — OXYCODONE-ACETAMINOPHEN 5-325 MG PO TABS
1.0000 | ORAL_TABLET | ORAL | Status: DC | PRN
  Administered 2013-11-22 (×2): 2 via ORAL
  Administered 2013-11-22: 1 via ORAL
  Filled 2013-11-21 (×2): qty 2

## 2013-11-21 MED ORDER — ONDANSETRON HCL 4 MG/2ML IJ SOLN: 4.0000 mg | Freq: Four times a day (QID) | INTRAMUSCULAR | Status: DC | PRN

## 2013-11-21 MED ORDER — ROCURONIUM BROMIDE 100 MG/10ML IV SOLN
INTRAVENOUS | Status: AC
  Filled 2013-11-21: qty 1

## 2013-11-21 MED ORDER — MENTHOL 3 MG MT LOZG: 1.0000 | LOZENGE | OROMUCOSAL | Status: DC | PRN

## 2013-11-21 MED ORDER — NEOSTIGMINE METHYLSULFATE 10 MG/10ML IV SOLN
INTRAVENOUS | Status: DC | PRN
  Administered 2013-11-21: 3 mg via INTRAVENOUS

## 2013-11-21 MED ORDER — FLUTICASONE PROPIONATE 50 MCG/ACT NA SUSP
1.0000 | Freq: Every day | NASAL | Status: DC
  Filled 2013-11-21: qty 16

## 2013-11-21 MED ORDER — SODIUM CHLORIDE 0.9 % IJ SOLN: 9.0000 mL | INTRAMUSCULAR | Status: DC | PRN

## 2013-11-21 MED ORDER — PHENYLEPHRINE 40 MCG/ML (10ML) SYRINGE FOR IV PUSH (FOR BLOOD PRESSURE SUPPORT)
PREFILLED_SYRINGE | INTRAVENOUS | Status: AC
  Filled 2013-11-21: qty 5

## 2013-11-21 MED ORDER — LIDOCAINE HCL (CARDIAC) 20 MG/ML IV SOLN
INTRAVENOUS | Status: AC
  Filled 2013-11-21: qty 5

## 2013-11-21 MED ORDER — STERILE WATER FOR IRRIGATION IR SOLN
Status: DC | PRN
Start: 2013-11-21 — End: 2013-11-21
  Administered 2013-11-21: 1000 mL

## 2013-11-21 MED ORDER — ACETAMINOPHEN 10 MG/ML IV SOLN
1000.0000 mg | Freq: Once | INTRAVENOUS | Status: AC
  Administered 2013-11-21: 1000 mg via INTRAVENOUS
  Filled 2013-11-21: qty 100

## 2013-11-21 MED ORDER — HYDROMORPHONE HCL PF 1 MG/ML IJ SOLN
INTRAMUSCULAR | Status: AC
  Filled 2013-11-21: qty 1

## 2013-11-21 MED ORDER — ONDANSETRON HCL 4 MG/2ML IJ SOLN
INTRAMUSCULAR | Status: DC | PRN
  Administered 2013-11-21: 4 mg via INTRAVENOUS

## 2013-11-21 MED ORDER — MORPHINE SULFATE (PF) 1 MG/ML IV SOLN
INTRAVENOUS | Status: DC
  Administered 2013-11-21: 12 mg via INTRAVENOUS
  Administered 2013-11-21: 13:00:00 via INTRAVENOUS
  Administered 2013-11-21: 3 mg via INTRAVENOUS
  Administered 2013-11-22: 1 mg via INTRAVENOUS
  Administered 2013-11-22: 4 mg via INTRAVENOUS
  Filled 2013-11-21: qty 25

## 2013-11-21 MED ORDER — MIDAZOLAM HCL 2 MG/2ML IJ SOLN
INTRAMUSCULAR | Status: DC | PRN
  Administered 2013-11-21: 2 mg via INTRAVENOUS

## 2013-11-21 MED ORDER — DEXAMETHASONE SODIUM PHOSPHATE 10 MG/ML IJ SOLN
INTRAMUSCULAR | Status: DC | PRN
  Administered 2013-11-21: 10 mg via INTRAVENOUS

## 2013-11-21 MED ORDER — SCOPOLAMINE 1 MG/3DAYS TD PT72
1.0000 | MEDICATED_PATCH | Freq: Once | TRANSDERMAL | Status: AC | PRN
  Filled 2013-11-21: qty 1

## 2013-11-21 MED ORDER — KETOROLAC TROMETHAMINE 30 MG/ML IJ SOLN
INTRAMUSCULAR | Status: AC
  Filled 2013-11-21: qty 1

## 2013-11-21 SURGICAL SUPPLY — 64 items
BARRIER ADHS 3X4 INTERCEED (GAUZE/BANDAGES/DRESSINGS) IMPLANT
CANISTER SUCT 3000ML (MISCELLANEOUS) ×3 IMPLANT
CATH FOLEY 3WAY  5CC 16FR (CATHETERS) ×1
CATH FOLEY 3WAY 5CC 16FR (CATHETERS) ×2 IMPLANT
CLOTH BEACON ORANGE TIMEOUT ST (SAFETY) ×3 IMPLANT
CONT PATH 16OZ SNAP LID 3702 (MISCELLANEOUS) ×6 IMPLANT
COVER LIGHT HANDLE  1/PK (MISCELLANEOUS) ×1
COVER LIGHT HANDLE 1/PK (MISCELLANEOUS) ×2 IMPLANT
COVER MAYO STAND STRL (DRAPES) ×3 IMPLANT
COVER TABLE BACK 60X90 (DRAPES) ×12 IMPLANT
COVER TIP SHEARS 8 DVNC (MISCELLANEOUS) ×2 IMPLANT
COVER TIP SHEARS 8MM DA VINCI (MISCELLANEOUS) ×1
DECANTER SPIKE VIAL GLASS SM (MISCELLANEOUS) ×6 IMPLANT
DERMABOND ADVANCED (GAUZE/BANDAGES/DRESSINGS) ×1
DERMABOND ADVANCED .7 DNX12 (GAUZE/BANDAGES/DRESSINGS) ×2 IMPLANT
DRAPE HUG U DISPOSABLE (DRAPE) ×3 IMPLANT
DRAPE HYSTEROSCOPY (DRAPE) ×3 IMPLANT
DRAPE WARM FLUID 44X44 (DRAPE) ×3 IMPLANT
DRSG COVADERM PLUS 2X2 (GAUZE/BANDAGES/DRESSINGS) ×3 IMPLANT
DRSG OPSITE POSTOP 3X4 (GAUZE/BANDAGES/DRESSINGS) ×3 IMPLANT
DURAPREP 26ML APPLICATOR (WOUND CARE) ×3 IMPLANT
ELECT REM PT RETURN 9FT ADLT (ELECTROSURGICAL) ×3
ELECTRODE REM PT RTRN 9FT ADLT (ELECTROSURGICAL) ×2 IMPLANT
EVACUATOR SMOKE 8.L (FILTER) ×3 IMPLANT
GAUZE VASELINE 3X9 (GAUZE/BANDAGES/DRESSINGS) IMPLANT
GLOVE BIO SURGEON STRL SZ 6.5 (GLOVE) ×6 IMPLANT
GLOVE BIOGEL PI IND STRL 6.5 (GLOVE) ×2 IMPLANT
GLOVE BIOGEL PI IND STRL 7.0 (GLOVE) ×4 IMPLANT
GLOVE BIOGEL PI INDICATOR 6.5 (GLOVE) ×1
GLOVE BIOGEL PI INDICATOR 7.0 (GLOVE) ×2
GOWN STRL REUS W/TWL LRG LVL3 (GOWN DISPOSABLE) ×30 IMPLANT
KIT ACCESSORY DA VINCI DISP (KITS) ×1
KIT ACCESSORY DVNC DISP (KITS) ×2 IMPLANT
LEGGING LITHOTOMY PAIR STRL (DRAPES) ×3 IMPLANT
OCCLUDER COLPOPNEUMO (BALLOONS) ×3 IMPLANT
PACK ROBOT WH (CUSTOM PROCEDURE TRAY) ×3 IMPLANT
PACK VAGINAL WOMENS (CUSTOM PROCEDURE TRAY) ×3 IMPLANT
PAD PREP 24X48 CUFFED NSTRL (MISCELLANEOUS) ×6 IMPLANT
PROTECTOR NERVE ULNAR (MISCELLANEOUS) ×6 IMPLANT
SET CYSTO W/LG BORE CLAMP LF (SET/KITS/TRAYS/PACK) ×3 IMPLANT
SET IRRIG TUBING LAPAROSCOPIC (IRRIGATION / IRRIGATOR) ×6 IMPLANT
SUT VIC AB 0 CT2 27 (SUTURE) ×9 IMPLANT
SUT VIC AB 2-0 SH 27 (SUTURE) ×2
SUT VIC AB 2-0 SH 27XBRD (SUTURE) ×4 IMPLANT
SUT VIC AB 2-0 UR6 27 (SUTURE) ×6 IMPLANT
SUT VIC AB 4-0 PS2 27 (SUTURE) ×6 IMPLANT
SUT VICRYL 0 UR6 27IN ABS (SUTURE) ×6 IMPLANT
SUT VLOC 180 2-0 9IN GS21 (SUTURE) ×3 IMPLANT
SYRINGE 10CC LL (SYRINGE) ×3 IMPLANT
SYRINGE 60CC LL (MISCELLANEOUS) ×6 IMPLANT
SYSTEM CONVERTIBLE TROCAR (TROCAR) IMPLANT
TIP RUMI ORANGE 6.7MMX12CM (TIP) IMPLANT
TIP UTERINE 5.1X6CM LAV DISP (MISCELLANEOUS) IMPLANT
TIP UTERINE 6.7X10CM GRN DISP (MISCELLANEOUS) IMPLANT
TIP UTERINE 6.7X6CM WHT DISP (MISCELLANEOUS) IMPLANT
TIP UTERINE 6.7X8CM BLUE DISP (MISCELLANEOUS) ×3 IMPLANT
TOWEL OR 17X24 6PK STRL BLUE (TOWEL DISPOSABLE) ×9 IMPLANT
TRAY FOLEY CATH 14FR (SET/KITS/TRAYS/PACK) ×3 IMPLANT
TROCAR DILATING TIP 12MM 150MM (ENDOMECHANICALS) ×3 IMPLANT
TROCAR DISP BLADELESS 8 DVNC (TROCAR) ×2 IMPLANT
TROCAR DISP BLADELESS 8MM (TROCAR) ×1
TROCAR XCEL NON-BLD 5MMX100MML (ENDOMECHANICALS) ×3 IMPLANT
WARMER LAPAROSCOPE (MISCELLANEOUS) ×3 IMPLANT
WATER STERILE IRR 1000ML POUR (IV SOLUTION) ×9 IMPLANT

## 2013-11-21 NOTE — Progress Notes (Signed)
Update to History and Physical  No marked change in status since last office visit.  Patient examined.   OK to proceed.

## 2013-11-21 NOTE — Addendum Note (Signed)
Addendum created 11/21/13 2056 by Asher Muir, CRNA   Modules edited: Notes Section   Notes Section:  File: [id number]

## 2013-11-21 NOTE — Progress Notes (Signed)
Day of Surgery Procedure(s) (LRB): ROBOTIC ASSISTED TOTAL HYSTERECTOMY WITH BILATERAL SALPINGO OOPHORECTOMY (Bilateral) CYSTOSCOPY (N/A)  Subjective: Patient reports some pressure sensation to have a bowel movement.  Did a bowel prep yesterday.  Tolerating clear liquids. Hungry.   Objective: I have reviewed patient's vital signs and intake and output.  General: alert Resp: clear to auscultation bilaterally Cardio: regular rate and rhythm, S1, S2 normal, no murmur, click, rub or gallop GI: incision: clean, dry and intact and  Scant bowel sounds, soft, and mildly tender.  No guarding or rebound.  Extremities:  PAS and ted hose on .  Vaginal Bleeding: minimal  Assessment: s/p Procedure(s): ROBOTIC ASSISTED TOTAL HYSTERECTOMY WITH BILATERAL SALPINGO OOPHORECTOMY (Bilateral) CYSTOSCOPY (N/A): stable  Plan: Continue foley due to  post operative state.  Clear liquids.  OK for crackers.  Morphine PCA and Toradol.  CBC and BMP in am.  Surgical findings and procedure discussed.  Questions answered.   LOS: 0 days    Brittany Kelly 11/21/2013, 3:49 PM

## 2013-11-21 NOTE — Transfer of Care (Signed)
Immediate Anesthesia Transfer of Care Note  Patient: Brittany Kelly  Procedure(s) Performed: Procedure(s): ROBOTIC ASSISTED TOTAL HYSTERECTOMY WITH BILATERAL SALPINGO OOPHORECTOMY (Bilateral) CYSTOSCOPY (N/A)  Patient Location: PACU  Anesthesia Type:General  Level of Consciousness: awake, alert  and oriented  Airway & Oxygen Therapy: Patient Spontanous Breathing and Patient connected to nasal cannula oxygen  Post-op Assessment: Report given to PACU RN, Post -op Vital signs reviewed and stable and Patient moving all extremities  Post vital signs: Reviewed and stable  Complications: No apparent anesthesia complications

## 2013-11-21 NOTE — Anesthesia Postprocedure Evaluation (Signed)
Anesthesia Post Note  Patient: Brittany Kelly  Procedure(s) Performed: Procedure(s) (LRB): ROBOTIC ASSISTED TOTAL HYSTERECTOMY WITH BILATERAL SALPINGO OOPHORECTOMY (Bilateral) CYSTOSCOPY (N/A)  Anesthesia type: General  Patient location: [facility] Unit  Post pain: Pain level controlled  Post assessment: Post-op Vital signs reviewed  Last Vitals:  Filed Vitals:   11/21/13 1953  BP:   Pulse:   Temp:   Resp: 15    Post vital signs: Reviewed  Level of consciousness: sedated  Complications: No apparent anesthesia complications

## 2013-11-21 NOTE — Anesthesia Preprocedure Evaluation (Signed)
Anesthesia Evaluation  Patient identified by MRN, date of birth, ID band Patient awake    Reviewed: Allergy & Precautions, H&P , NPO status , Patient's Chart, lab work & pertinent test results, reviewed documented beta blocker date and time   History of Anesthesia Complications (+) PONV and history of anesthetic complications  Airway Mallampati: III TM Distance: >3 FB Neck ROM: full    Dental  (+) Teeth Intact   Pulmonary neg pulmonary ROS,  breath sounds clear to auscultation  Pulmonary exam normal       Cardiovascular negative cardio ROS  Rhythm:regular Rate:Normal     Neuro/Psych negative neurological ROS  negative psych ROS   GI/Hepatic negative GI ROS, Neg liver ROS,   Endo/Other  negative endocrine ROS  Renal/GU negative Renal ROS  Female GU complaint     Musculoskeletal   Abdominal   Peds  Hematology negative hematology ROS (+)   Anesthesia Other Findings   Reproductive/Obstetrics negative OB ROS                           Anesthesia Physical Anesthesia Plan  ASA: II  Anesthesia Plan: General ETT   Post-op Pain Management:    Induction:   Airway Management Planned:   Additional Equipment:   Intra-op Plan:   Post-operative Plan:   Informed Consent: I have reviewed the patients History and Physical, chart, labs and discussed the procedure including the risks, benefits and alternatives for the proposed anesthesia with the patient or authorized representative who has indicated his/her understanding and acceptance.   Dental Advisory Given  Plan Discussed with: CRNA and Surgeon  Anesthesia Plan Comments:         Anesthesia Quick Evaluation

## 2013-11-21 NOTE — Anesthesia Postprocedure Evaluation (Signed)
  Anesthesia Post-op Note  Patient: Brittany Kelly  Procedure(s) Performed: Procedure(s): ROBOTIC ASSISTED TOTAL HYSTERECTOMY WITH BILATERAL SALPINGO OOPHORECTOMY (Bilateral) CYSTOSCOPY (N/A)  Patient Location: PACU  Anesthesia Type:General  Level of Consciousness: awake, alert  and oriented  Airway and Oxygen Therapy: Patient Spontanous Breathing  Post-op Pain: mild  Post-op Assessment: Post-op Vital signs reviewed, Patient's Cardiovascular Status Stable, Respiratory Function Stable, Patent Airway, No signs of Nausea or vomiting and Pain level controlled  Post-op Vital Signs: Reviewed and stable  Last Vitals:  Filed Vitals:   11/21/13 1230  BP:   Pulse:   Temp: 36.7 C  Resp:     Complications: No apparent anesthesia complications

## 2013-11-21 NOTE — Brief Op Note (Signed)
11/21/2013  11:10 AM  PATIENT:  Brittany Kelly  43 y.o. female  PRE-OPERATIVE DIAGNOSIS:  menorrhagia, fibroids, pelvic pain  POST-OPERATIVE DIAGNOSIS:  menorrhagia, fibroids,pelvic pain, mild endometriosis  PROCEDURE:  Procedure(s): ROBOTIC ASSISTED TOTAL HYSTERECTOMY WITH BILATERAL SALPINGO OOPHORECTOMY (Bilateral) CYSTOSCOPY (N/A)  SURGEON:  Surgeon(s) and Role:    * Brook E Amundson de Berton Lan, MD - Primary    * Lyman Speller, MD - Assisting  PHYSICIAN ASSISTANT:   ASSISTANTS: Lyman Speller, MD   ANESTHESIA:   local, general and  intraperitoneal Ropivicaine  EBL:  Total I/O In: 1000 [I.V.:1000] Out: 500 [Urine:400; Blood:100]  BLOOD ADMINISTERED:none  DRAINS: Urinary Catheter (Foley)   LOCAL MEDICATIONS USED:  MARCAINE     SPECIMEN:  Source of Specimen:   Uterus, cervix, bilateral tubes and ovaries  DISPOSITION OF SPECIMEN:  PATHOLOGY  COUNTS:  YES  TOURNIQUET:  * No tourniquets in log *  DICTATION: .Other Dictation: Dictation Number    PLAN OF CARE: Admit for overnight observation  PATIENT DISPOSITION:  PACU - hemodynamically stable.   Delay start of Pharmacological VTE agent (>24hrs) due to surgical blood loss or risk of bleeding: not applicable

## 2013-11-22 ENCOUNTER — Encounter (HOSPITAL_COMMUNITY): Payer: Self-pay | Admitting: Obstetrics and Gynecology

## 2013-11-22 LAB — CBC WITH DIFFERENTIAL/PLATELET
BASOS PCT: 0 % (ref 0–1)
Basophils Absolute: 0 10*3/uL (ref 0.0–0.1)
EOS ABS: 0.1 10*3/uL (ref 0.0–0.7)
EOS PCT: 0 % (ref 0–5)
HCT: 29.9 % — ABNORMAL LOW (ref 36.0–46.0)
HEMOGLOBIN: 9.6 g/dL — AB (ref 12.0–15.0)
Lymphocytes Relative: 25 % (ref 12–46)
Lymphs Abs: 2.9 10*3/uL (ref 0.7–4.0)
MCH: 23.6 pg — AB (ref 26.0–34.0)
MCHC: 32.1 g/dL (ref 30.0–36.0)
MCV: 73.6 fL — AB (ref 78.0–100.0)
MONOS PCT: 6 % (ref 3–12)
Monocytes Absolute: 0.7 10*3/uL (ref 0.1–1.0)
NEUTROS PCT: 69 % (ref 43–77)
Neutro Abs: 8.1 10*3/uL — ABNORMAL HIGH (ref 1.7–7.7)
Platelets: 356 10*3/uL (ref 150–400)
RBC: 4.06 MIL/uL (ref 3.87–5.11)
RDW: 15.9 % — ABNORMAL HIGH (ref 11.5–15.5)
WBC: 11.7 10*3/uL — ABNORMAL HIGH (ref 4.0–10.5)

## 2013-11-22 LAB — CBC
HCT: 30.2 % — ABNORMAL LOW (ref 36.0–46.0)
HEMOGLOBIN: 9.9 g/dL — AB (ref 12.0–15.0)
MCH: 23.8 pg — AB (ref 26.0–34.0)
MCHC: 32.8 g/dL (ref 30.0–36.0)
MCV: 72.6 fL — ABNORMAL LOW (ref 78.0–100.0)
Platelets: 376 10*3/uL (ref 150–400)
RBC: 4.16 MIL/uL (ref 3.87–5.11)
RDW: 15.7 % — AB (ref 11.5–15.5)
WBC: 16.7 10*3/uL — ABNORMAL HIGH (ref 4.0–10.5)

## 2013-11-22 LAB — BASIC METABOLIC PANEL
Anion gap: 8 (ref 5–15)
BUN: 9 mg/dL (ref 6–23)
CHLORIDE: 102 meq/L (ref 96–112)
CO2: 28 mEq/L (ref 19–32)
CREATININE: 0.58 mg/dL (ref 0.50–1.10)
Calcium: 8.6 mg/dL (ref 8.4–10.5)
GFR calc Af Amer: >90 mL/min (ref >90)
GFR calc non Af Amer: >90 mL/min (ref >90)
Glucose, Bld: 109 mg/dL — ABNORMAL HIGH (ref 70–99)
Potassium: 4.3 mEq/L (ref 3.7–5.3)
Sodium: 138 mEq/L (ref 137–147)

## 2013-11-22 MED ORDER — FERROUS SULFATE 325 (65 FE) MG PO TABS: ORAL_TABLET | ORAL | Status: DC

## 2013-11-22 MED ORDER — IBUPROFEN 600 MG PO TABS: 600.0000 mg | ORAL_TABLET | Freq: Four times a day (QID) | ORAL | Status: DC | PRN

## 2013-11-22 MED ORDER — FAMOTIDINE 10 MG PO TABS: 10.0000 mg | ORAL_TABLET | Freq: Two times a day (BID) | ORAL | Status: DC

## 2013-11-22 MED ORDER — OXYCODONE-ACETAMINOPHEN 5-325 MG PO TABS: 1.0000 | ORAL_TABLET | ORAL | Status: DC | PRN

## 2013-11-22 NOTE — Discharge Instructions (Signed)
Histerectoma total laparoscpica, cuidados posteriores (Total Laparoscopic Hysterectomy, Care After) Siga estas instrucciones durante las prximas semanas. Estas indicaciones le proporcionan informacin acerca de cmo deber cuidarse despus del procedimiento. El mdico tambin podr darle instrucciones ms especficas. El tratamiento se ha planificado de acuerdo a las prcticas mdicas actuales, pero a veces se producen problemas. Comunquese con el mdico si tiene algn problema o tiene dudas despus del procedimiento. QU ESPERAR DESPUS DEL PROCEDIMIENTO  Dolor y hematomas en los sitios de incisin. Le darn analgsicos para Financial controller.  Podr tener sntomas de menopausia, como calor repentino, sudoraciones nocturnas e insomnio si le han extirpado los ovarios.  Podr sentir dolor de garganta por el tubo del respirador que le colocaron durante la [place]. INSTRUCCIONES PARA EL CUIDADO EN EL HOGAR  Utilice los medicamentos de venta libre o recetados para Glass blower/designer, el malestar o la fiebre, segn se lo indique el mdico.  No tome aspirina. Puede ocasionar hemorragias.  No conduzca mientras est tomando analgsicos.  Siga las indicaciones de su mdico con respecto a la dieta, la actividad fsica, levantar objetos, conducir y para las actividades en general.  Reanude su dieta habitual, segn las indicaciones y los permisos.  Descanse y duerma lo suficiente.  Nose haga duchas vaginales, no utilice tampones ni tenga relaciones sexuales durante al menos 6 semanas o hasta que el profesional la autorice.  Cambie los apsitos (vendajes) tal como le indic su mdico.  Controle su temperatura y notifique a su mdico si tiene fiebre.  Tome duchas en lugar de baos durante 2 a 3 semanas.  No beba alcohol hasta que el mdico la autorice.  Si est estreida, tome un laxante suave, si el mdico la [place]. Los alimentos que contienen salvado la ayudarn para el problema de la  estreimiento. Debe ingerir la cantidad de lquidos suficientes para Theatre manager la orina de tono claro o color amarillo plido.  Trate de que alguien la acompae en su casa durante 1 o 2semanas, para ayudarla con los [employer].  Cumpla con todas las visitas de control, segn le indique su mdico. SOLICITE ATENCIN MDICA SI:  Observa enrojecimiento, hinchazn o siente dolor cada vez ms intenso en los sitios de las incisiones.  Tiene pus en el sitio de la incisin.  Advierte un olor feo que proviene de la incisin.  La incisin se abre.  Se siente mareada o sufre un desmayo.  Siente dolor o tiene una hemorragia al [employer].  Tiene diarrea persistente.  Tiene nuseas o vmitos persistentes.  Tiene flujo vaginal anormal.  Tiene una erupcin cutnea.  Tiene alguna reaccin anormal o aparece una alergia por los medicamentos.  El dolor no se alivia con los medicamentos recetados. SOLICITE ATENCIN MDICA DE INMEDIATO SI:  Siente falta de aire o dolor en el pecho.  Siente dolor intenso abdominal que no se alivia con los [employer].  Presenta dolor o hinchazn en las piernas. ASEGRESE DE QUE:  Comprende estas instrucciones.  Controlar su afeccin.  Recibir ayuda de inmediato si no mejora o si empeora. Document Released: 02/22/2013 Document Revised: 05/09/2013 [facility] Patient Information 2015 [place]. This information is not intended to replace advice given to you by your health care provider. Make sure you discuss any questions you have with your health care provider.  Total Laparoscopic Hysterectomy, Care After Refer to this sheet in the next few weeks. These instructions provide you with information on caring for yourself after your procedure. Your health care provider may also give you more  specific instructions. Your treatment has been planned according to current medical practices, but problems sometimes occur. Call your health care provider if you  have any problems or questions after your procedure. WHAT TO EXPECT AFTER THE PROCEDURE  Pain and bruising at the incision sites. You will be given pain medicine to control it.  Menopausal symptoms such as hot flashes, night sweats, and insomnia if your ovaries were removed.  Sore throat from the breathing tube that was inserted during surgery. HOME CARE INSTRUCTIONS  Only take over-the-counter or prescription medicines for pain, discomfort, or fever as directed by your health care provider.   Do not take aspirin. It can cause bleeding.   Do not drive when taking pain medicine.   Follow your health care provider's advice regarding diet, exercise, lifting, driving, and general activities.   Resume your usual diet as directed and allowed.   Get plenty of rest and sleep.   Do not douche, use tampons, or have sexual intercourse for at least 6 weeks, or until your health care provider gives you permission.   Change your bandages (dressings) as directed by your health care provider.   Monitor your temperature and notify your health care provider of a fever.   Take showers instead of baths for 2-3 weeks.   Do not drink alcohol until your health care provider gives you permission.   If you develop constipation, you may take a mild laxative with your health care provider's permission. Bran foods may help with constipation problems. Drinking enough fluids to keep your urine clear or pale yellow may help as well.   Try to have someone home with you for 1-2 weeks to help around the house.   Keep all of your follow-up appointments as directed by your health care provider.  SEEK MEDICAL CARE IF:  You have swelling, redness, or increasing pain around your incision sites.   You have pus coming from your incision.   You notice a bad smell coming from your incision.   Your incision breaks open.   You feel dizzy or lightheaded.   You have pain or bleeding when you  urinate.   You have persistent diarrhea.   You have persistent nausea and vomiting.   You have abnormal vaginal discharge.   You have a rash.   You have any type of abnormal reaction or develop an allergy to your medicine.   You have poor pain control with your prescribed medicine.  SEEK IMMEDIATE MEDICAL CARE IF:  You have chest pain or shortness of breath.  You have severe abdominal pain that is not relieved with pain medicine.  You have pain or swelling in your legs. MAKE SURE YOU:  Understand these instructions.  Will watch your condition.  Will get help right away if you are not doing well or get worse. Document Released: 02/22/2013 Document Revised: 05/09/2013 Document Reviewed: 02/22/2013 [facility] Patient Information 2015 [place], [place]. This information is not intended to replace advice given to you by your health care provider. Make sure you discuss any questions you have with your health care provider.

## 2013-11-22 NOTE — Progress Notes (Signed)
1 Day Post-Op Procedure(s) (LRB): ROBOTIC ASSISTED TOTAL HYSTERECTOMY WITH BILATERAL SALPINGO OOPHORECTOMY (Bilateral) CYSTOSCOPY (N/A)  Subjective: Patient reports tolerating PO, + flatus and no problems voiding.   Good pain control.  Notes some history of epigastric pain and wants to know what to take for that.   Objective: I have reviewed patient's vital signs, intake and output, labs and pathology. WBC 11.7, Hgb 9.6. Pathology - adenomyosis of uterus.   General: alert Resp: clear to auscultation bilaterally Cardio: regular rate and rhythm, S1, S2 normal, no murmur, click, rub or gallop GI: soft, non-tender; bowel sounds normal; no masses,  no organomegaly and incision: clean, dry, intact and  umbilical incision with ecchymoses.  Vaginal Bleeding: minimal  Assessment: s/p Procedure(s): ROBOTIC ASSISTED TOTAL HYSTERECTOMY WITH BILATERAL SALPINGO OOPHORECTOMY (Bilateral) CYSTOSCOPY (N/A): progressing well and anemia Clinically does not have signs of intraperitoneal bleeding.  Plan: Discharge home Rx for Percocet and Ibuprofen.  Take OTC Prilosec or Pepcid daily.  FeSO4 325 mg po bid.  Instructions/precautions given.  Follow up tomorrow for a recheck and Hgb check.    LOS: 1 day    Amundson de Berton Lan 11/22/2013, 7:11 PM

## 2013-11-22 NOTE — Progress Notes (Signed)
Discharge instructions reviewed with patient, family member and significant other.  All members present verbalized understanding of discharge instructions.  No complaints.  To car via wheel chair with NT.

## 2013-11-22 NOTE — Progress Notes (Signed)
1 Day Post-Op Procedure(s) (LRB): ROBOTIC ASSISTED TOTAL HYSTERECTOMY WITH BILATERAL SALPINGO OOPHORECTOMY (Bilateral) CYSTOSCOPY (N/A)  Subjective: Patient reports ambulating.  Notes some pelvic pressure.  Voided just now.  Good pain control.  Hungry.  No flatus yet.   Objective: I have reviewed patient's vital signs, intake and output and labs. T 98, BP 117/73, P67, RR 15. UO 3550 cc 7/7 UO 200 cc 7/8 WBC 16.7 Hgb 9.9 Cr 0.58   General: alert Resp: clear to auscultation bilaterally Cardio: regular rate and rhythm, S1, S2 normal, no murmur, click, rub or gallop GI: soft, non-tender; bowel sounds normal; no masses,  no organomegaly and incision: clean, dry and intact Vaginal Bleeding: none  Assessment: s/p Procedure(s): ROBOTIC ASSISTED TOTAL HYSTERECTOMY WITH BILATERAL SALPINGO OOPHORECTOMY (Bilateral) CYSTOSCOPY (N/A): progressing well Elevated WBC likely due to dissection of surgery.  Hgb lower than expected for procedure and EBL.  Plan: Advance diet Encourage ambulation Advance to PO medication Will monitor in hospital during the day and reassess in the evening.  CBC with differential at 16:00.  Plan of care explained to patient.  Reviewed surgical procedure and findings again.  Patient indicates understanding.   LOS: 1 day    Amundson de Berton Lan 11/22/2013, 8:14 AM

## 2013-11-22 NOTE — Op Note (Signed)
NAMESEQUOYA, HOGSETT NO.:  [id number]  MEDICAL RECORD NO.:  [id number]  LOCATION:  9303                          FACILITY:  [facility]  PHYSICIAN:  Lenard Galloway, M.D.   DATE OF BIRTH:  [birth year]  DATE OF PROCEDURE:  11/21/2013 DATE OF DISCHARGE:                              OPERATIVE REPORT   PREOPERATIVE DIAGNOSES:  Menorrhagia, uterine fibroids, pelvic pain.  POSTOPERATIVE DIAGNOSES:  Menorrhagia, fibroids, pelvic pain, mild endometriosis.  PROCEDURES:  Da Vinci robotic total laparoscopic hysterectomy with bilateral salpingo-oophorectomy and cystoscopy.  SURGEON:  Lenard Galloway, M.D.  ASSISTANT:  Felipa Emory, MD  ANESTHESIA:  General endotracheal, intraperitoneal ropivacaine, local 0.25% Marcaine.  IV FLUIDS:  1000 mL Ringer's lactate.  EBL:  100 mL.  URINE OUTPUT:  400 mL.  COMPLICATIONS:  None.  INDICATIONS FOR THE PROCEDURE:  The patient is a 44 year old gravida 1, para 1-0-0-1 Hispanic female, who presented with heavy menstrual periods and pelvic pain.  The patient had been treated with oral contraceptives, which were not significantly helping either her pain or her bleeding. The patient had a pelvic ultrasound, which documented small uterine fibroids and normal ovaries.  An endometrial biopsy was benign.  The patient has completed her childbearing and wishes for definitive surgical treatment including hysterectomy with removal of tubes and ovaries.  She has had a prior cesarean section and a prior cholecystectomy.  The plan is made to proceed with a robotic total laparoscopic hysterectomy with bilateral salpingo-oophorectomy, cystoscopy, and possible lysis of adhesions.  Risks, benefits, and alternatives have been reviewed with the patient who wishes to proceed.  FINDINGS:  Examination under anesthesia revealed a small anteverted mobile uterus.  No adnexal masses were appreciated.  At the time of laparoscopy, the patient was noted to  have a mildly enlarged uterus with intramural fibroids.  The bilateral ovaries and fallopian tubes were unremarkable.  There was evidence of scarring from old endometriosis of the peritoneum of the pelvis.  The largest concentration was along the patient's left pelvic sidewall.  The left broad ligament appeared to be thickened and scarred from old endometriosis.  There were some adhesions between the bladder and the lower uterine segment.  There were no lesions of endometriosis over the rectosigmoid colon.  The appendix was unremarkable.  The upper abdomen demonstrated omental adhesions in the right upper quadrant.  The liver was unremarkable.  Cystoscopy at termination of the procedure demonstrated the bladder to be normal throughout 360 degrees including the bladder dome and trigone. The ureters were patent bilaterally.  There was no evidence of a foreign body in the bladder or the urethra.  The ureters were noted to be patent bilaterally.  DESCRIPTION OF PROCEDURE:  The patient was reidentified in the preoperative hold area.  She received cefotetan 2 g IV for antibiotic prophylaxis.  She received TED hose and PAS stockings for DVT prophylaxis.  In the operating room, the patient was placed in the supine position on the operating room table and general endotracheal anesthesia was induced.  The TrenGuard padding device was used to support the patient's neck, shoulders, and head.  The arms were tucked at the sides.  The patient was placed in the  dorsal lithotomy position using the Allen stirrups.  An examination under anesthesia was performed.  The patient was sterilely prepped and draped.  A speculum was placed inside the vagina and a single-tooth tenaculum was placed on the anterior cervical lip.  A figure-of-eight suture of 0 Vicryl was placed on each of the anterior and the posterior cervical lips.  The uterus was sounded to 9 cm.  The cervix was dilated with the [facility]  dilators.  The RUMI device with a small KOH ring initially would not go on the cervix well and the Foley catheter was therefore placed to draw the bladder out of the way.  Clear yellow urine drained, and the Foley catheter was left to gravity drainage throughout the procedure. The KOH ring with a #8 tip was then successfully placed inside the uterine cavity and the KOH ring had a good application to the cervix.  Attention was turned to the abdomen where the umbilicus was injected with 0.25% Marcaine.  A small incision was created in the umbilicus for placement of the Veress needle into the peritoneal cavity.  A saline drop test was performed and the fluid flowed.  The CO2 pneumoperitoneum was then achieved.  A supraumbilical incision was then created with a scalpel and the umbilical trocar was placed inside the peritoneal cavity.  The laparoscope confirmed proper placement.  An 8-mm trocars were then placed 10 cm to the right and left of the umbilicus.  The skin was injected locally with 0.25% Marcaine, the incisions were created sharply with a scalpel, and the trocars were placed under visualization of the laparoscope.  The final 5-mm trocar was then placed in the right lower quadrant.  The skin was again injected locally with 0.25% Marcaine, the incision was sharply created with a scalpel, and the trocar was placed under direct visualization of the laparoscope.  The patient was placed in Trendelenburg position at this time and the robot was docked on the patient's right-hand side.  The PK instrument was placed through the robotic trocar on the patient's left-hand side under visualization of the laparoscope and the monopolar scissors was placed under direct visualization of the laparoscope through the robotic port on the patient's right-hand side.  At this time, I stepped to the surgeon's console while my assistant stayed at the patient's bedside.  The procedure began with  identification of the patient's anatomy.  The ureter on the patient's left-hand side was a challenge to visualize initially due to the thickness of the peritoneum overlying this area. Some congenital adhesions of the sigmoid colon to the left pelvic brim were then sharply dissected to create mobility of the infundibulopelvic ligament on that side.  The infundibulopelvic ligament was then carefully identified and was triply cauterized with the bipolar PK instrument and then was sharply bisected using the monopolar scissors. The left round ligament was then triply cauterized with the PK instrument and bisected with monopolar scissors.  Dissection continued through the posterior broad ligament using the monopolar scissors.  The tissue was noted to be quite thickened in this area.  The bladder flap was taken down anteriorly.  Dissection continued across the anterior lower uterine segment to remove the adhesions and the vesicouterine folds from the patient's prior cesarean section.  This was performed without difficulty.  Attention was then turned to the posterior lip of the broad ligament on the patient's left-hand side and this was taken down sharply.  Again, the tissue was noted to be very thick and then the dissection  proceeded very carefully.  Attention was then turned to the patient's right-hand side.  The right ureter was identified well.  The infundibulopelvic ligament was triply cauterized with the PK instrument and then bisected with the monopolar scissors.  Dissection continued through the broad ligament using the monopolar scissors.  The round ligament was triply cauterized with the PK instrument and then bisected again with the monopolar scissors.  The remainder of the bladder flap was taken down anteriorly.  The bladder was dissected well off of the lower uterine segment.  The peritoneum was taken down posteriorly using the monopolar scissors.  The uterine artery was  skeletonized on the patient's right-hand side and was cauterized with the PK instrument and then bisected with the laparoscopic scissors. The dissection was carried down to the level of the KOH ring.  Attention was then turned back to the patient's left-hand side where that uterine artery was cauterized with the PK instrument and then sharply bisected with the scissors.  At this point, there was 100% visualization of the outline of KOH ring on the vagina and the colpotomy incision was created with the monopolar scissors throughout 360 degrees.  The specimen was removed from the vagina at this time, and the uterus, cervix with bilateral tubes and ovaries were sent to Pathology.  The vaginal occluder was placed.  A 0 V-Loc suture was then used to close the vaginal cuff using the Cobra instrument and the suture cut device.  This was a running suture beginning from the patient's right vaginal cuff and extending over to the left cuff and then two sutures back toward the midline.  The pelvis was irrigated and suctioned at this time.  There was a little bit of bleeding and some of the adipose tissue along the dissection of the bladder on the patient's left-hand side and this responded to monopolar and bipolar cautery.  All of the pedicles were examined and found to be hemostatic at this time.  The suture was held with a [place] grasper at this time.  The robot was undocked.  The robotic instruments had already been removed under visualization of the camera.  The [place] grasper was holding the suture through the right robotic port and it was removed under visualization of the laparoscope.  The trocars were removed under visualization of the laparoscope.  Manual deep breaths were used to remove any remaining CO2 gas from the peritoneal cavity.  The umbilical trocar was removed last.  The umbilical fascia was closed with a figure-of-eight suture of 0 Vicryl.  All of the skin incisions  were closed with a subcuticular closure of 4-0 Vicryl.  The incisions were closed with Dermabond and then sterile bandages were placed.  Cystoscopy was performed after the Foley catheter was removed.  The findings were as noted above.  A 14-French Foley catheter was then replaced and left to gravity drainage.  The patient was cleansed of any Betadine.  She was awakened and extubated.  She was escorted to the recovery room in stable condition. There were no complications to the procedure.  All needle, instrument, and sponge counts were correct.     Lenard Galloway, M.D.     BES/MEDQ  D:  11/21/2013  T:  11/22/2013  Job:  [id number]

## 2013-11-22 NOTE — Progress Notes (Signed)
Ur chart review completed.  

## 2013-11-23 ENCOUNTER — Encounter: Payer: Self-pay | Admitting: Obstetrics and Gynecology

## 2013-11-23 ENCOUNTER — Ambulatory Visit (INDEPENDENT_AMBULATORY_CARE_PROVIDER_SITE_OTHER): Payer: BC Managed Care – PPO | Admitting: Obstetrics and Gynecology

## 2013-11-23 VITALS — BP 122/80 | HR 76 | Resp 18 | Ht 63.0 in | Wt 200.0 lb

## 2013-11-23 DIAGNOSIS — Z9889 Other specified postprocedural states: Secondary | ICD-10-CM

## 2013-11-23 DIAGNOSIS — D62 Acute posthemorrhagic anemia: Secondary | ICD-10-CM

## 2013-11-23 LAB — HEMOGLOBIN, FINGERSTICK: Hemoglobin, fingerstick: 10.7 g/dL — ABNORMAL LOW (ref 12.0–16.0)

## 2013-11-23 NOTE — Patient Instructions (Signed)
Call if you need anything! 

## 2013-11-23 NOTE — Progress Notes (Signed)
GYNECOLOGY  VISIT   HPI: 43 y.o.   Married  Hispanic  female   G1P1001 with Patient's last menstrual period was 10/13/2013.   here for   A recheck.  Status post robotic total laparoscopic hysterectomy with bilateral salpingo-oophorectomy for fibroids, endometriosis and adenomyosis.  Discharged yesterday from hospital.  Yesterday am had WBC 16.7 and Hgb 9.9. Last evening had WBC 11.7 and Hgb 9.6. Was clinically well and so discharged to home.  Some respiratory congestion during the night.  Snoring. Using incentive spirometer. No shortness of breath at at this time.  Headaches after surgery.  Some headaches. Took pain medication at 10:00 last night.   Hgb now 10.7  GYNECOLOGIC HISTORY: Patient's last menstrual period was 10/13/2013. Contraception:    Menopausal hormone therapy:         OB History   Grav Para Term Preterm Abortions TAB SAB Ect Mult Living   1 1 1       1          Patient Active Problem List   Diagnosis Date Noted  . Status post laparoscopic hysterectomy 11/21/2013  . Proteinuria 10/05/2013  . Menorrhagia 10/05/2013  . Metrorrhagia 10/05/2013  . Pain in joint, pelvic region and thigh 10/05/2013    Past Medical History  Diagnosis Date  . PONV (postoperative nausea and vomiting)   . Medical history non-contributory     Past Surgical History  Procedure Laterality Date  . Cyst excision Bilateral 1995    under both arms  . Cesarean section  1996  . Cholecystectomy    . Cholecystectomy    . Robotic assisted total hysterectomy with bilateral salpingo oopherectomy Bilateral 11/21/2013    Procedure: ROBOTIC ASSISTED TOTAL HYSTERECTOMY WITH BILATERAL SALPINGO OOPHORECTOMY;  Surgeon: Jamey Reas de Berton Lan, MD;  Location: [facility];  Service: Gynecology;  Laterality: Bilateral;  . Cystoscopy N/A 11/21/2013    Procedure: CYSTOSCOPY;  Surgeon: Jamey Reas de Berton Lan, MD;  Location: [facility];  Service: Gynecology;  Laterality: N/A;     Current Outpatient Prescriptions  Medication Sig Dispense Refill  . famotidine (PEPCID AC) 10 MG tablet Take 1 tablet (10 mg total) by mouth 2 (two) times daily.  60 tablet  0  . fluticasone (FLONASE) 50 MCG/ACT nasal spray Place 1 spray into both nostrils daily.      Marland Kitchen ibuprofen (ADVIL,MOTRIN) 600 MG tablet Take 1 tablet (600 mg total) by mouth every 6 (six) hours as needed (mild pain).  30 tablet  0  . oxyCODONE-acetaminophen (PERCOCET/ROXICET) 5-325 MG per tablet Take 1-2 tablets by mouth every 4 (four) hours as needed for severe pain (moderate to severe pain (when tolerating fluids)).  30 tablet  0  . ferrous sulfate 325 (65 FE) MG tablet Take one (325 mg) by mouth twice a day for anemia.  60 tablet  1   No current facility-administered medications for this visit.     ALLERGIES: Review of patient's allergies indicates no known allergies.  Family History  Problem Relation Age of Onset  . Diabetes Mother   . Hypertension Mother   . Hypertension Father     History   Social History  . Marital Status: Married    Spouse Name: N/A    Number of Children: N/A  . Years of Education: N/A   Occupational History  . Not on file.   Social History Main Topics  . Smoking status: Never Smoker   . Smokeless tobacco: Never Used  . Alcohol  Use: 2.0 oz/week    4 drink(s) per week  . Drug Use: No  . Sexual Activity: Yes    Partners: Male    Birth Control/ Protection: OCP, Pill     Comment: Aviane 0.1/20   Other Topics Concern  . Not on file   Social History Narrative  . No narrative on file    ROS:  Pertinent items are noted in HPI.  PHYSICAL EXAMINATION:    BP 122/80  Pulse 76  Resp 18  Ht 5\' 3"  (1.6 m)  Wt 200 lb (90.719 kg)  BMI 35.44 kg/m2  LMP 10/13/2013     General appearance: alert, cooperative and appears stated age Lungs: clear to auscultation bilaterally Heart: regular rate and rhythm Abdomen: soft, non-tender; no masses,  no organomegaly No abnormal  inguinal nodes palpated  ASSESSMENT  Stable post op.  Hgb improved.  PLAN  Continue home based care.  Take iron bid.  Percocet and Motrin prn.   Return 2 weeks for recheck and prn.    An After Visit Summary was printed and given to the patient.  __15____ minutes face to face time of which over 50% was spent in counseling.

## 2013-11-23 NOTE — Addendum Note (Signed)
Addended by: Elroy Channel on: 11/23/2013 09:29 AM   Modules accepted: Orders

## 2013-11-25 ENCOUNTER — Inpatient Hospital Stay (HOSPITAL_COMMUNITY)
Admission: AD | Admit: 2013-11-25 | Discharge: 2013-11-25 | Disposition: A | Discharge status: Home / Self Care | Payer: BC Managed Care – PPO | Source: Ambulatory Visit | Attending: Obstetrics & Gynecology | Admitting: Obstetrics & Gynecology

## 2013-11-25 ENCOUNTER — Telehealth: Payer: Self-pay | Admitting: Obstetrics & Gynecology

## 2013-11-25 ENCOUNTER — Encounter (HOSPITAL_COMMUNITY): Payer: Self-pay | Admitting: *Deleted

## 2013-11-25 DIAGNOSIS — R6883 Chills (without fever): Secondary | ICD-10-CM | POA: Insufficient documentation

## 2013-11-25 DIAGNOSIS — R0602 Shortness of breath: Secondary | ICD-10-CM | POA: Insufficient documentation

## 2013-11-25 DIAGNOSIS — R079 Chest pain, unspecified: Secondary | ICD-10-CM | POA: Insufficient documentation

## 2013-11-25 DIAGNOSIS — R110 Nausea: Secondary | ICD-10-CM | POA: Insufficient documentation

## 2013-11-25 LAB — URINALYSIS, ROUTINE W REFLEX MICROSCOPIC
Bilirubin Urine: NEGATIVE
GLUCOSE, UA: NEGATIVE mg/dL
KETONES UR: NEGATIVE mg/dL
Leukocytes, UA: NEGATIVE
Nitrite: NEGATIVE
PROTEIN: NEGATIVE mg/dL
Specific Gravity, Urine: <1.005 — ABNORMAL LOW (ref 1.005–1.030)
Urobilinogen, UA: 0.2 mg/dL (ref 0.0–1.0)
pH: 6.5 (ref 5.0–8.0)

## 2013-11-25 LAB — BASIC METABOLIC PANEL
ANION GAP: 12 (ref 5–15)
BUN: 12 mg/dL (ref 6–23)
CALCIUM: 9.6 mg/dL (ref 8.4–10.5)
CO2: 28 mEq/L (ref 19–32)
CREATININE: 0.68 mg/dL (ref 0.50–1.10)
Chloride: 97 mEq/L (ref 96–112)
GFR calc Af Amer: >90 mL/min (ref >90)
Glucose, Bld: 105 mg/dL — ABNORMAL HIGH (ref 70–99)
Potassium: 4.4 mEq/L (ref 3.7–5.3)
Sodium: 137 mEq/L (ref 137–147)

## 2013-11-25 LAB — CBC
HCT: 35.3 % — ABNORMAL LOW (ref 36.0–46.0)
Hemoglobin: 11.6 g/dL — ABNORMAL LOW (ref 12.0–15.0)
MCH: 24 pg — ABNORMAL LOW (ref 26.0–34.0)
MCHC: 32.9 g/dL (ref 30.0–36.0)
MCV: 72.9 fL — ABNORMAL LOW (ref 78.0–100.0)
PLATELETS: 410 10*3/uL — AB (ref 150–400)
RBC: 4.84 MIL/uL (ref 3.87–5.11)
RDW: 15.7 % — ABNORMAL HIGH (ref 11.5–15.5)
WBC: 10.6 10*3/uL — ABNORMAL HIGH (ref 4.0–10.5)

## 2013-11-25 LAB — URINE MICROSCOPIC-ADD ON

## 2013-11-25 LAB — CK TOTAL AND CKMB (NOT AT ARMC)
CK, MB: <1 ng/mL (ref 0.3–4.0)
Total CK: 49 U/L (ref 7–177)

## 2013-11-25 LAB — TROPONIN I

## 2013-11-25 MED ORDER — LACTATED RINGERS IV SOLN: INTRAVENOUS | Status: DC

## 2013-11-25 MED ORDER — LACTATED RINGERS IV BOLUS (SEPSIS)
500.0000 mL | Freq: Once | INTRAVENOUS | Status: DC
Start: 2013-11-25 — End: 2013-11-25

## 2013-11-25 NOTE — MAU Note (Signed)
Pt presents to MAU with complaints of shortness of breath, and feeling sick to her stomach. S/P laparoscopic hysterectomy on Tuesday.

## 2013-11-25 NOTE — Telephone Encounter (Signed)
Spouse called saying pt is having SOB, chest pain, looks pale and doesn't know what do to.  [facility] and I will meet them there.  They live 20 minutes away.

## 2013-11-25 NOTE — Discharge Instructions (Signed)
Hysterectomy Information A hysterectomy is a surgery to remove your uterus. After surgery, you will no longer have periods. Also, you will not be able to get pregnant.  REASONS FOR THIS SURGERY  You have bleeding that is not normal and keeps coming back.  You have lasting (chronic) lower belly (pelvic) pain.  You have a lasting infection.  The lining of your uterus grows outside your uterus.  The lining of your uterus grows in the muscle of your uterus.  Your uterus falls down into your vagina.  You have a growth in your uterus that causes problems.  You have cells that could turn into cancer (precancerous cells).  You have cancer of the uterus or cervix. TYPES  There are 3 types of hysterectomies. Depending on the type, the surgery will:  Remove the top part of the uterus only.  Remove the uterus and the cervix.  Remove the uterus, cervix, and tissue that holds the uterus in place in the lower belly. WAYS A HYSTERECTOMY CAN BE PERFORMED There are 5 ways this surgery can be performed.   A cut (incision) is made in the belly (abdomen). The uterus is taken out through the cut.  A cut is made in the vagina. The uterus is taken out through the cut.  Three or four cuts are made in the belly. A surgical device with a camera is put through one of the cuts. The uterus is cut into small pieces. The uterus is taken out through the cuts or the vagina.  Three or four cuts are made in the belly. A surgical device with a camera is put through one of the cuts. The uterus is taken out through the vagina.  Three or four cuts are made in the belly. A surgical device that is controlled by a computer makes a visual image. The device helps the surgeon control the surgical tools. The uterus is cut into small pieces. The pieces are taken out through the cuts or through the vagina. WHAT TO EXPECT AFTER THE SURGERY  You will be given pain medicine.  You will need help at home for 3-5 days after  surgery.  You will need to see your doctor in 2-4 weeks after surgery.  You may get hot flashes, have night sweats, and have trouble sleeping.  You may need to have Pap tests in the future if your surgery was related to cancer. Talk to your doctor. It is still good to have regular exams. Document Released: 07/27/2011 Document Revised: 02/22/2013 Document Reviewed: 01/09/2013 [facility] Patient Information 2015 [place], [place]. This information is not intended to replace advice given to you by your health care provider. Make sure you discuss any questions you have with your health care provider. Dehydration, Adult Dehydration is when you lose more fluids from the body than you take in. Vital organs like the kidneys, brain, and heart cannot function without a proper amount of fluids and salt. Any loss of fluids from the body can cause dehydration.  CAUSES   Vomiting.  Diarrhea.  Excessive sweating.  Excessive urine output.  Fever. SYMPTOMS  Mild dehydration  Thirst.  Dry lips.  Slightly dry mouth. Moderate dehydration  Very dry mouth.  Sunken eyes.  Skin does not bounce back quickly when lightly pinched and released.  Dark urine and decreased urine production.  Decreased tear production.  Headache. Severe dehydration  Very dry mouth.  Extreme thirst.  Rapid, weak pulse (more than 100 beats per minute at rest).  Cold hands  and feet.  Not able to sweat in spite of heat and temperature.  Rapid breathing.  Blue lips.  Confusion and lethargy.  Difficulty being awakened.  Minimal urine production.  No tears. DIAGNOSIS  Your caregiver will diagnose dehydration based on your symptoms and your exam. Blood and urine tests will help confirm the diagnosis. The diagnostic evaluation should also identify the cause of dehydration. TREATMENT  Treatment of mild or moderate dehydration can often be done at home by increasing the amount of fluids that you drink. It is  best to drink small amounts of fluid more often. Drinking too much at one time can make vomiting worse. Refer to the home care instructions below. Severe dehydration needs to be treated at the hospital where you will probably be given intravenous (IV) fluids that contain water and electrolytes. HOME CARE INSTRUCTIONS   Ask your caregiver about specific rehydration instructions.  Drink enough fluids to keep your urine clear or pale yellow.  Drink small amounts frequently if you have nausea and vomiting.  Eat as you normally do.  Avoid:  Foods or drinks high in sugar.  Carbonated drinks.  Juice.  Extremely hot or cold fluids.  Drinks with caffeine.  Fatty, greasy foods.  Alcohol.  Tobacco.  Overeating.  Gelatin desserts.  Wash your hands well to avoid spreading bacteria and viruses.  Only take over-the-counter or prescription medicines for pain, discomfort, or fever as directed by your caregiver.  Ask your caregiver if you should continue all prescribed and over-the-counter medicines.  Keep all follow-up appointments with your caregiver. SEEK MEDICAL CARE IF:  You have abdominal pain and it increases or stays in one area (localizes).  You have a rash, stiff neck, or severe headache.  You are irritable, sleepy, or difficult to awaken.  You are weak, dizzy, or extremely thirsty. SEEK IMMEDIATE MEDICAL CARE IF:   You are unable to keep fluids down or you get worse despite treatment.  You have frequent episodes of vomiting or diarrhea.  You have blood or green matter (bile) in your vomit.  You have blood in your stool or your stool looks black and tarry.  You have not urinated in 6 to 8 hours, or you have only urinated a small amount of very dark urine.  You have a fever.  You faint. MAKE SURE YOU:   Understand these instructions.  Will watch your condition.  Will get help right away if you are not doing well or get worse. Document Released:  05/04/2005 Document Revised: 07/27/2011 Document Reviewed: 12/22/2010 [facility] Patient Information 2015 [place], [place]. This information is not intended to replace advice given to you by your health care provider. Make sure you discuss any questions you have with your health care provider.

## 2013-11-25 NOTE — Discharge Summary (Signed)
Physician Discharge Summary  Patient ID: Brittany Kelly MRN: [id number] DOB/AGE: Apr 25, 1971 43 y.o.  Admit date: 11/21/2013 Discharge date: 11/22/13  Admission Diagnoses:   1. Menorrhagia 2. Uterine fibroids 3   Pelvic pain.     Discharge Diagnoses:  1. Menorrhagia 2. Fibroids 3. Pelvic pain 4. Mild endometriosis.  5. Status post [place] robotic total laparoscopic hysterectomy with bilateral salpingo-oophorectomy and cystoscopy on 11/21/13.  Active Problems:   Status post laparoscopic hysterectomy   Discharged Condition: good  Hospital Course: The patient was admitted on 11/21/13 for a Da Vinci robotic total laparoscopic hysterectomy with bilateral salpingo-oophorectomy and cystoscopy which were performed without complication while under general anesthesia.  The patient's post op course was uneventful.  She had a morphine PCA and Toradol for pain control initially, and this was converted over to Percocet and Motrin on post op day one when the patient began taking po well.  She ambulated independently and wore PAS and Ted hose for DVT prophylaxis while in bed.  Her foley catheter were removed on post op day one, and she voided without difficulty.  The patient's post op day one morning Hgb was 9.6 and WBC was 16.7.  She was observed during the day on post op day one, and her vital signs and clinical picture remained good.  A recheck on her CBC in the late afternoon showed a WBC of 11.7 and Hgb 9.6.   She was found to be in good condition and ready for discharge on post op day one.     Consults: None  Significant Diagnostic Studies: labs:  See hospital course for summary of labs. Pathology:  Adenomyosis and leiomyomata of the uterus, benign cervix, ovaries and tubes.  Paratubal cyst.   Treatments:  Surgery:   Da Vinci robotic total laparoscopic hysterectomy with bilateral salpingo-oophorectomy and cystoscopy on 11/21/13.  Discharge Exam: Blood pressure 130/83, pulse 78, temperature 98.3 F  (36.8 C), temperature source Oral, resp. rate 16, height 5\' 3"  (1.6 m), weight 180 lb (81.647 kg), SpO2 98.00%. General: alert  Resp: clear to auscultation bilaterally  Cardio: regular rate and rhythm, S1, S2 normal, no murmur, click, rub or gallop  GI: soft, non-tender; bowel sounds normal; no masses, no organomegaly and incision: clean, dry, intact and umbilical incision with ecchymoses.  Vaginal Bleeding: minimal   Disposition: 01-Home or Self Care  Instructions and precautions given to the patient.     Medication List    STOP taking these medications       levonorgestrel-ethinyl estradiol 0.1-20 MG-MCG tablet  Commonly known as:  AVIANE,ALESSE,LESSINA     traMADol 50 MG tablet  Commonly known as:  ULTRAM      TAKE these medications       famotidine 10 MG tablet  Commonly known as:  PEPCID AC  Take 1 tablet (10 mg total) by mouth 2 (two) times daily.     ferrous sulfate 325 (65 FE) MG tablet  Take one (325 mg) by mouth twice a day for anemia.     fluticasone 50 MCG/ACT nasal spray  Commonly known as:  FLONASE  Place 1 spray into both nostrils daily.     ibuprofen 600 MG tablet  Commonly known as:  ADVIL,MOTRIN  Take 1 tablet (600 mg total) by mouth every 6 (six) hours as needed (mild pain).     oxyCODONE-acetaminophen 5-325 MG per tablet  Commonly known as:  PERCOCET/ROXICET  Take 1-2 tablets by mouth every 4 (four) hours as needed for severe pain (  moderate to severe pain (when tolerating fluids)).           Follow-up Information   Follow up with Amundson de Berton Lan, MD In 1 day.   Specialty:  Obstetrics and Gynecology   Contact information:   [address] [place] [address] [phone]       Signed: Tacy Learn 11/25/2013, 1:21 PM

## 2013-11-25 NOTE — Progress Notes (Signed)
Dr. Sabra Heck called unit. Ok for pt to d/c home with post op instructions and RN to encourage hydration.

## 2013-11-25 NOTE — H&P (Addendum)
Brittany Kelly is an 43 y.o. female G1P1 [name] whose husband called med this afternoon with a complaint that his wife, who is post-op day 4 from Robotic TLH/bilateral salpingoophrectomy/cystoscopy, was having SOB and chest pain and looked pale.  He didn't know what to do so I advised them to come straight to the ER at [facility].  I arrived shortly after they arrived.  Pt reports she has been doing pretty well until today when she took a shower.  This was around noon.  After getting out of the shower, she had an episode of feeling light headed, had cold sweats, had nausea, had SOB, and had chest pain.  She called for her husband who immediately called me.  She reports she got dressed and ate a little piece of chocolate which really helped her symptoms.  In fact, she is really not feeling that badly when I evaluated her.  She is having some mild nausea and still feels a little cold but the chest pain and SOB have completely resolved.    She is having bowel movements.  She drank some prune juice yesterday and this caused her to have her first bowel movement.  Bladder functioning feels normal to her.  She is having minimal bleeding.  She is passing flatus.  She denies dysuria.  She denies fever.  She is sore but not more than she expected to be.  She is taking the motrin and pain medicine Dr. Quincy Simmonds prescribed.  She is taking something about every 6 hours.    Pertinent Gynecological History: OB History: G1, P1   Menstrual History: Patient's last menstrual period was 10/13/2013.    Past Medical History  Diagnosis Date  . PONV (postoperative nausea and vomiting)   . Medical history non-contributory     Past Surgical History  Procedure Laterality Date  . Cyst excision Bilateral 1995    under both arms  . Cesarean section  1996  . Cholecystectomy    . Cholecystectomy    . Robotic assisted total hysterectomy with bilateral salpingo oopherectomy Bilateral 11/21/2013   Procedure: ROBOTIC ASSISTED TOTAL HYSTERECTOMY WITH BILATERAL SALPINGO OOPHORECTOMY;  Surgeon: Jamey Reas de Berton Lan, MD;  Location: [facility];  Service: Gynecology;  Laterality: Bilateral;  . Cystoscopy N/A 11/21/2013    Procedure: CYSTOSCOPY;  Surgeon: Jamey Reas de Berton Lan, MD;  Location: [facility];  Service: Gynecology;  Laterality: N/A;    Family History  Problem Relation Age of Onset  . Diabetes Mother   . Hypertension Mother   . Hypertension Father     Social History:  reports that she has never smoked. She has never used smokeless tobacco. She reports that she drinks about 2 ounces of alcohol per week. She reports that she does not use illicit drugs.  Allergies: No Known Allergies  Prescriptions prior to admission  Medication Sig Dispense Refill  . famotidine (PEPCID AC) 10 MG tablet Take 1 tablet (10 mg total) by mouth 2 (two) times daily.  60 tablet  0  . ferrous sulfate 325 (65 FE) MG tablet Take one (325 mg) by mouth twice a day for anemia.  60 tablet  1  . fluticasone (FLONASE) 50 MCG/ACT nasal spray Place 1 spray into both nostrils daily.      Marland Kitchen ibuprofen (ADVIL,MOTRIN) 600 MG tablet Take 1 tablet (600 mg total) by mouth every 6 (six) hours as needed (mild pain).  30 tablet  0  . oxyCODONE-acetaminophen (PERCOCET/ROXICET) 5-325 MG  per tablet Take 1-2 tablets by mouth every 4 (four) hours as needed for severe pain (moderate to severe pain (when tolerating fluids)).  30 tablet  0    Review of Systems  Constitutional: Positive for chills.  Respiratory: Positive for shortness of breath (that has resolved).   Cardiovascular: Positive for chest pain (that has resolved).  Neurological: Positive for weakness.  Psychiatric/Behavioral: Negative.   All other systems reviewed and are negative.   Blood pressure 155/86, pulse 79, temperature 97.6 F (36.4 C), resp. rate 18, last menstrual period 10/13/2013, SpO2 97.00%. Physical Exam  Constitutional: She is  oriented to person, place, and time. She appears well-developed and well-nourished.  HENT:  Head: Normocephalic and atraumatic.  Cardiovascular: Normal rate, regular rhythm and normal heart sounds.   Respiratory: Effort normal and breath sounds normal. No respiratory distress. She has no wheezes. She has no rales.  GI: Soft. Bowel sounds are normal. She exhibits no distension and no mass. There is tenderness (on right side). There is no rebound and no guarding.  Genitourinary:  No vaginal bleeding on pad  Musculoskeletal: She exhibits no edema.  Neurological: She is alert and oriented to person, place, and time.  Skin: Skin is warm and dry. No rash noted. No erythema. No pallor.  Psychiatric: She has a normal mood and affect.    No results found for this or any previous visit (from the past 24 hour(s)).  No results found.  Assessment/Plan: 43 yo G80P1 Hispanic female with history of robotic TLH/BSO 11/21/13 by Dr. Quincy Simmonds, here with episode of SOB, chest pain, chills, and nausea.  SOB and chest pain resolved before arriving at the hospital.    Will check CBC, BMD, cardiac enzymes, EKG.  Plan 500cc LR bolus.  Will proceed from there.  Hale Bogus SUZANNE 11/25/2013, 3:12 PM  Hb slightly higher than expected. WBC ct improved.  Urine neg.  BMP nl.  EKg nl.  Pt symptoms completely resolved with fluid bolus.  CK nl.  Troponin nl.  Feel discharge appropriate.  Will follow up with pt next week  Advised to return with any return of symptoms.  Spouse with pt.  Voiced clear understanding.

## 2013-11-28 ENCOUNTER — Other Ambulatory Visit: Payer: Self-pay | Admitting: Obstetrics and Gynecology

## 2013-11-28 ENCOUNTER — Telehealth: Payer: Self-pay | Admitting: *Deleted

## 2013-11-28 MED ORDER — IBUPROFEN 600 MG PO TABS: 600.0000 mg | ORAL_TABLET | Freq: Four times a day (QID) | ORAL | Status: DC | PRN

## 2013-11-28 MED ORDER — OXYCODONE-ACETAMINOPHEN 5-325 MG PO TABS: 1.0000 | ORAL_TABLET | ORAL | Status: DC | PRN

## 2013-11-28 NOTE — Telephone Encounter (Signed)
See previous phone message.  Call to patient's husband Marchia Bond and notified RX from dr Sabra Heck has been signed and is ready to be picked up. Again, discussed alternating this with Motrin and can try heating pad on low, precautions to have pillow case between it and patient and not to sleep with it, reviewed with husband.  Would need OV before additional narcotics prescribed. Scheduled to see Dr Quincy Simmonds next week.  Please note when husband picks up RX then can route to Dr Sabra Heck for final close.

## 2013-11-28 NOTE — Telephone Encounter (Signed)
Both Last refilled: 11/22/13 #30/0 refills  Last AEX: 10/18/13 Patient recently had surgery (Hysterectomy 11/21/13)S/w   [facility] Sousa at [facility] patient is needing refills will need to have a paper rx in hand for them to fill it  Please Advise.  (Sent to Dr. Sabra Heck given Dr. Quincy Simmonds out of office today)

## 2013-11-28 NOTE — Telephone Encounter (Signed)
Ibuprofen and Oxycodone refills requested by patient's husband. He states his wife is still "in a lot of pain" and "needs the refill as soon as possible because she is completley out."  Pharmacy: [id number] - [place], [place] - [phone]

## 2013-11-28 NOTE — Telephone Encounter (Signed)
See next phone encounter. Patient husband notified RX ready to be picked up.

## 2013-11-28 NOTE — Telephone Encounter (Signed)
Call to patient, spoke to husband Brittany Kelly. He states patient "doing fine" but still has a "little pain." Needs a little more medication so he called for refill. He states he believes she is taking one pain pill every for hours. He states that the Motrin is not enough to control pain. Asked him if he could confirm the number of Hydrocodone she is taking and he states he is not with her now but he is sure she is taking one Q4hrs. States she has some dizziness with medication but otherwise fine. Voiding well and incision "looks good." He states she does not have any fever.  Advised I will review with Dr Sabra Heck, who will determine RX but will need to start decreasing frequency of Hydrocodone dose, possibly alternate with Motrin. Advised will have to pick up paer RX as cant be called in and he states he can do this. Advised I will call him after MD review.

## 2013-12-04 ENCOUNTER — Ambulatory Visit (INDEPENDENT_AMBULATORY_CARE_PROVIDER_SITE_OTHER): Payer: BC Managed Care – PPO | Admitting: Obstetrics and Gynecology

## 2013-12-04 ENCOUNTER — Encounter: Payer: Self-pay | Admitting: Obstetrics and Gynecology

## 2013-12-04 VITALS — BP 100/70 | HR 80 | Resp 18 | Ht 63.0 in | Wt 192.0 lb

## 2013-12-04 DIAGNOSIS — Z09 Encounter for follow-up examination after completed treatment for conditions other than malignant neoplasm: Secondary | ICD-10-CM

## 2013-12-04 NOTE — Progress Notes (Signed)
GYNECOLOGY  VISIT   HPI: 43 y.o.   Married  Hispanic  female   G1P1001 with Patient's last menstrual period was 10/13/2013.   here for   Follow up from surgery.  Status post robotic hysterectomy with BSO 11/21/13.  Notes some umbilical and right incisional tenderness.  Taking Metamucil for constipation symptoms.  Voiding well.  Hot flashes and night sweats. Has a dull headache.  Used to get menstrual headaches.   Traveling to the [place] on August 7.   Patient seen in the ER on 11/26/13 for SOB and dizziness.  Was diagnosed with dehydration.  Cardiac enzymes and EKG were normal.   GYNECOLOGIC HISTORY: Patient's last menstrual period was 10/13/2013. Contraception:  Hysterectomy   Menopausal hormone therapy: N/A        OB History   Grav Para Term Preterm Abortions TAB SAB Ect Mult Living   1 1 1       1          Patient Active Problem List   Diagnosis Date Noted  . Status post laparoscopic hysterectomy 11/21/2013  . Proteinuria 10/05/2013  . Menorrhagia 10/05/2013  . Metrorrhagia 10/05/2013  . Pain in joint, pelvic region and thigh 10/05/2013    Past Medical History  Diagnosis Date  . PONV (postoperative nausea and vomiting)   . Medical history non-contributory     Past Surgical History  Procedure Laterality Date  . Cyst excision Bilateral 1995    under both arms  . Cesarean section  1996  . Cholecystectomy    . Cholecystectomy    . Robotic assisted total hysterectomy with bilateral salpingo oopherectomy Bilateral 11/21/2013    Procedure: ROBOTIC ASSISTED TOTAL HYSTERECTOMY WITH BILATERAL SALPINGO OOPHORECTOMY;  Surgeon: Jamey Reas de Berton Lan, MD;  Location: [facility];  Service: Gynecology;  Laterality: Bilateral;  . Cystoscopy N/A 11/21/2013    Procedure: CYSTOSCOPY;  Surgeon: Jamey Reas de Berton Lan, MD;  Location: [facility];  Service: Gynecology;  Laterality: N/A;    Current Outpatient Prescriptions  Medication Sig Dispense Refill   . ibuprofen (ADVIL,MOTRIN) 600 MG tablet Take 1 tablet (600 mg total) by mouth every 6 (six) hours as needed (mild pain).  30 tablet  0  . psyllium (METAMUCIL) 58.6 % packet Take 1 packet by mouth daily.      . famotidine (PEPCID AC) 10 MG tablet Take 1 tablet (10 mg total) by mouth 2 (two) times daily.  60 tablet  0  . ferrous sulfate 325 (65 FE) MG tablet Take one (325 mg) by mouth twice a day for anemia.  60 tablet  1  . fluticasone (FLONASE) 50 MCG/ACT nasal spray Place 1 spray into both nostrils daily.      Marland Kitchen oxyCODONE-acetaminophen (PERCOCET/ROXICET) 5-325 MG per tablet Take 1-2 tablets by mouth every 4 (four) hours as needed for severe pain (moderate to severe pain (when tolerating fluids)).  30 tablet  0   No current facility-administered medications for this visit.     ALLERGIES: Review of patient's allergies indicates no known allergies.  Family History  Problem Relation Age of Onset  . Diabetes Mother   . Hypertension Mother   . Hypertension Father     History   Social History  . Marital Status: Married    Spouse Name: N/A    Number of Children: N/A  . Years of Education: N/A   Occupational History  . Not on file.   Social History Main Topics  . Smoking  status: Never Smoker   . Smokeless tobacco: Never Used  . Alcohol Use: 2.0 oz/week    4 drink(s) per week  . Drug Use: No  . Sexual Activity: Yes    Partners: Male    Birth Control/ Protection: OCP, Pill     Comment: Aviane 0.1/20   Other Topics Concern  . Not on file   Social History Narrative  . No narrative on file    ROS:  Pertinent items are noted in HPI.  PHYSICAL EXAMINATION:    BP 100/70  Pulse 80  Resp 18  Ht 5\' 3"  (1.6 m)  Wt 192 lb (87.091 kg)  BMI 34.02 kg/m2  LMP 10/13/2013     General appearance: alert, cooperative and appears stated age Abdomen: incisions intact and without hernia formation, soft, mildly tender suprapubicaly.  No abnormal inguinal nodes palpated  Pelvic:  External genitalia:  no lesions              Urethra:  normal appearing urethra with no masses, tenderness or lesions              Bartholins and Skenes: normal                 Vagina:  no lesions, cuff intact.               Cervix:  Absent.                    Bimanual Exam:  Uterus:   absent                                      Adnexa: no masses, nontender.                                        ASSESSMENT  Post op doing well.  Constipations.  Incisional pain.  Menopausal symptoms.  Tolerable.   PLAN  Motrin prn pain.  Colace 100 mg po bid prn.  Discussed physical limitations at this time but also recommended good ambulation.  Will start ERT at post op visit in August due to endometriosis noted at surgery.  Declines starting today.    An After Visit Summary was printed and given to the patient.  _20_____ minutes face to face time of which over 50% was spent in counseling.

## 2013-12-11 NOTE — Telephone Encounter (Signed)
Ok to [employer]

## 2013-12-11 NOTE — Telephone Encounter (Signed)
FYI--RX is still in patient pick up drawer up front.

## 2013-12-11 NOTE — Telephone Encounter (Signed)
Patient was seen in office by Dr Quincy Simmonds on 12-04-13 for post op check. Was instructed to take Motrin for incisional discomfort by dr Quincy Simmonds at that time.  Will route to Dr Quincy Simmonds to confirm but I think we need to shred that prescription.

## 2013-12-14 NOTE — Telephone Encounter (Signed)
Rx shredded personally with witnessed by Denyse Dago, CMA. Encounter closed.

## 2013-12-20 ENCOUNTER — Ambulatory Visit: Payer: BC Managed Care – PPO | Admitting: Obstetrics and Gynecology

## 2014-01-04 ENCOUNTER — Ambulatory Visit (INDEPENDENT_AMBULATORY_CARE_PROVIDER_SITE_OTHER): Payer: BC Managed Care – PPO | Admitting: Obstetrics and Gynecology

## 2014-01-04 ENCOUNTER — Encounter: Payer: Self-pay | Admitting: Obstetrics and Gynecology

## 2014-01-04 VITALS — BP 120/80 | HR 84 | Ht 63.0 in | Wt 196.6 lb

## 2014-01-04 DIAGNOSIS — N951 Menopausal and female climacteric states: Secondary | ICD-10-CM

## 2014-01-04 DIAGNOSIS — Z9889 Other specified postprocedural states: Secondary | ICD-10-CM

## 2014-01-04 DIAGNOSIS — R300 Dysuria: Secondary | ICD-10-CM

## 2014-01-04 LAB — POCT URINALYSIS DIPSTICK
Bilirubin, UA: NEGATIVE
GLUCOSE UA: NEGATIVE
Ketones, UA: NEGATIVE
NITRITE UA: NEGATIVE
PH UA: 5
Protein, UA: NEGATIVE
UROBILINOGEN UA: NEGATIVE

## 2014-01-04 MED ORDER — ESTRADIOL 0.1 MG/24HR TD PTTW: 1.0000 | MEDICATED_PATCH | TRANSDERMAL | Status: DC

## 2014-01-04 MED ORDER — CIPROFLOXACIN HCL 500 MG PO TABS: 500.0000 mg | ORAL_TABLET | Freq: Two times a day (BID) | ORAL | Status: DC

## 2014-01-04 NOTE — Progress Notes (Signed)
Patient ID: Brittany Kelly, female   DOB: January 11, 1971, 43 y.o.   MRN: [id number] GYNECOLOGY  VISIT   HPI: 43 y.o.   Married  Hispanic  female   G1P1001 with Patient's last menstrual period was 10/13/2013.   here for  6 weeks post op. Patient presents with Spanish interpretor.   Status post robotic hysterectomy with BSO.  Having hot flashes and night sweats.   Decrease in urination and slight burning/pressure with urination for one week.  Bowel function is good.  No vaginal bleeding.  Some yellow discharge.   Urine Dip: 1+WBC's, Trace RBC's  GYNECOLOGIC HISTORY: Patient's last menstrual period was 10/13/2013. Contraception:  Hysterectomy  Menopausal hormone therapy: none        OB History   Grav Para Term Preterm Abortions TAB SAB Ect Mult Living   1 1 1       1          Patient Active Problem List   Diagnosis Date Noted  . Status post laparoscopic hysterectomy 11/21/2013  . Proteinuria 10/05/2013  . Menorrhagia 10/05/2013  . Metrorrhagia 10/05/2013  . Pain in joint, pelvic region and thigh 10/05/2013    Past Medical History  Diagnosis Date  . PONV (postoperative nausea and vomiting)   . Medical history non-contributory     Past Surgical History  Procedure Laterality Date  . Cyst excision Bilateral 1995    under both arms  . Cesarean section  1996  . Cholecystectomy    . Cholecystectomy    . Robotic assisted total hysterectomy with bilateral salpingo oopherectomy Bilateral 11/21/2013    Procedure: ROBOTIC ASSISTED TOTAL HYSTERECTOMY WITH BILATERAL SALPINGO OOPHORECTOMY;  Surgeon: Jamey Reas de Berton Lan, MD;  Location: [facility];  Service: Gynecology;  Laterality: Bilateral;  . Cystoscopy N/A 11/21/2013    Procedure: CYSTOSCOPY;  Surgeon: Jamey Reas de Berton Lan, MD;  Location: [facility];  Service: Gynecology;  Laterality: N/A;    Current Outpatient Prescriptions  Medication Sig Dispense Refill  . ibuprofen (ADVIL,MOTRIN) 600 MG tablet Take 1  tablet (600 mg total) by mouth every 6 (six) hours as needed (mild pain).  30 tablet  0  . psyllium (METAMUCIL) 58.6 % packet Take 1 packet by mouth daily.      . ferrous sulfate 325 (65 FE) MG tablet Take one (325 mg) by mouth twice a day for anemia.  60 tablet  1  . fluticasone (FLONASE) 50 MCG/ACT nasal spray Place 1 spray into both nostrils daily.       No current facility-administered medications for this visit.     ALLERGIES: Review of patient's allergies indicates no known allergies.  Family History  Problem Relation Age of Onset  . Diabetes Mother   . Hypertension Mother   . Hypertension Father     History   Social History  . Marital Status: Married    Spouse Name: N/A    Number of Children: N/A  . Years of Education: N/A   Occupational History  . Not on file.   Social History Main Topics  . Smoking status: Never Smoker   . Smokeless tobacco: Never Used  . Alcohol Use: 2.0 oz/week    4 drink(s) per week  . Drug Use: No  . Sexual Activity: Yes    Partners: Male     Comment: R-TAH/BSO   Other Topics Concern  . Not on file   Social History Narrative  . No narrative on file    ROS:  Pertinent items are noted in HPI.  PHYSICAL EXAMINATION:    BP 120/80  Pulse 84  Ht 5\' 3"  (1.6 m)  Wt 196 lb 9.6 oz (89.177 kg)  BMI 34.83 kg/m2  LMP 10/13/2013     General appearance: alert, cooperative and appears stated age   Abdomen: incisions intact, soft, non-tender; no masses,  no organomegaly No abnormal inguinal nodes palpated  Pelvic: External genitalia:  no lesions              Urethra:  normal appearing urethra with no masses, tenderness or lesions              Bartholins and Skenes: normal                 Vagina: normal appearing vagina with normal color and discharge, no lesions.  Cuff intact.  Tender along anterior vaginal wall in bladder area               Cervix:  absent                   Bimanual Exam:  Uterus:   absent                                       Adnexa:  no masses                                     ASSESSMENT  Status post robotic total laparoscopic hysterectomy with BSO.  Dysuria.  I suspect UTI. Menopausal symptoms.  PLAN   Minivelle 0.1 mg twice weekly.  Precautions given regarding risks/signs/symptoms of thromboembolic events.   Ciprofloxacin 500 mg po bid for 7 days.  Urine culture.  Return  for recheck in 2 months.    An After Visit Summary was printed and given to the patient.   _20___ minutes face to face time of which over 50% was spent in counseling.

## 2014-01-05 ENCOUNTER — Telehealth: Payer: Self-pay | Admitting: Obstetrics and Gynecology

## 2014-01-05 MED ORDER — ESTRADIOL 0.1 MG/24HR TD PTTW: 1.0000 | MEDICATED_PATCH | TRANSDERMAL | Status: DC

## 2014-01-05 NOTE — Telephone Encounter (Signed)
Pt's husband calling regarding pt medication. The Estradiol 0.1mg  [phone].

## 2014-01-06 LAB — URINE CULTURE: Colony Count: 70000

## 2014-01-07 ENCOUNTER — Other Ambulatory Visit: Payer: Self-pay | Admitting: Obstetrics and Gynecology

## 2014-01-07 MED ORDER — AMPICILLIN 500 MG PO CAPS: 500.0000 mg | ORAL_CAPSULE | Freq: Four times a day (QID) | ORAL | Status: DC

## 2014-01-08 NOTE — Telephone Encounter (Signed)
Late entry for 01/05/14 at 1555. Husband of patient calling. He states that the [facility] that [place] was sent to does not have this rx. He is [facility].  Routing to provider for final review. Patient agreeable to disposition. Will close encounter

## 2014-01-15 ENCOUNTER — Ambulatory Visit (INDEPENDENT_AMBULATORY_CARE_PROVIDER_SITE_OTHER): Payer: BC Managed Care – PPO | Admitting: Nurse Practitioner

## 2014-01-15 ENCOUNTER — Telehealth: Payer: Self-pay | Admitting: Obstetrics and Gynecology

## 2014-01-15 ENCOUNTER — Encounter: Payer: Self-pay | Admitting: Nurse Practitioner

## 2014-01-15 VITALS — BP 108/70 | HR 88 | Resp 16 | Ht 63.0 in | Wt 198.0 lb

## 2014-01-15 DIAGNOSIS — B3731 Acute candidiasis of vulva and vagina: Secondary | ICD-10-CM

## 2014-01-15 DIAGNOSIS — R3915 Urgency of urination: Secondary | ICD-10-CM

## 2014-01-15 DIAGNOSIS — B373 Candidiasis of vulva and vagina: Secondary | ICD-10-CM

## 2014-01-15 LAB — POCT URINALYSIS DIPSTICK
BILIRUBIN UA: NEGATIVE
Glucose, UA: NEGATIVE
KETONES UA: NEGATIVE
Nitrite, UA: NEGATIVE
PH UA: 5
Protein, UA: NEGATIVE
Urobilinogen, UA: NEGATIVE

## 2014-01-15 MED ORDER — CIPROFLOXACIN HCL 500 MG PO TABS: 500.0000 mg | ORAL_TABLET | Freq: Two times a day (BID) | ORAL | Status: DC

## 2014-01-15 MED ORDER — FLUCONAZOLE 150 MG PO TABS: 150.0000 mg | ORAL_TABLET | Freq: Once | ORAL | Status: DC

## 2014-01-15 NOTE — Progress Notes (Signed)
Subjective:     Patient ID: Brittany Kelly, female   DOB: November 06, 1970, 43 y.o.   MRN: [id number]  HPI  This 43 yo M Hispanic Fe G1, P1 with history of 7/11 robotic hysterectomy with BSO for menorrhagia and pelvic pain.  Now she is complaining of pelvic pressure and swelling.   She is having most of pain on the left lower abdomen.  She states she continues to have urinary urgency and frequency even after the recent Ampicillin for group B strep UTI.  She does have some vaginal irritation.  She has not resumed SA since surgery.  Interpreter is with the patient  Review of Systems  Constitutional: Negative for fever, chills and fatigue.  Gastrointestinal: Positive for abdominal pain. Negative for nausea, vomiting, diarrhea and constipation.  Genitourinary: Positive for urgency, frequency, vaginal discharge and vaginal pain. Negative for dysuria, flank pain, vaginal bleeding and dyspareunia.  Musculoskeletal: Negative.   Skin: Negative.   Neurological: Negative.   Psychiatric/Behavioral: Negative.        Objective:   Physical Exam  Constitutional: She is oriented to person, place, and time. She appears well-developed and well-nourished.  Abdominal: Soft. Bowel sounds are normal. She exhibits no distension. There is tenderness. There is no rebound and no guarding.  No true flank pain.  BS are normal.  Tender supra pubic and some left lower abdomen.  Genitourinary:  Vaginal exam with a lot of thick white cottage cheese consistent with yeast vaginitis.  Posterior vault without bleeding - but not easily visualized secondary to discharge.   Neurological: She is alert and oriented to person, place, and time.  Skin: Skin is warm and dry.  Psychiatric: She has a normal mood and affect. Her behavior is normal. Judgment and thought content normal.       Assessment:     R/O recurrent UTI Yeast vaginitis from recent antibiotic therapy    Plan:     Will give her Cipro 500 mg BID Follow with  urine culture Diflucan 150 mg X 2 doses for now and a refill if needed after this antibiotic therapy If symptoms not improved to call back.

## 2014-01-15 NOTE — Telephone Encounter (Signed)
Patient's husband "Marchia Bond" calling, stating his  wife is having abdominal pain and would like an appointment. DPR on file to talk with "marvin".

## 2014-01-15 NOTE — Telephone Encounter (Signed)
Spoke with patient's husband Marchia Bond. He states that patient has "pressure in abdomin and bladder. She was not feeling well this weekend and would like to see the doctor." States that she is not urinating as often and has back pain and feels hot. Patient was treated with antibiotics on 8/20 for UTI. Offered 9:30 am appointment with Dr.Silva but he states that patient is at work and can not come until after 12pm. Advised would check schedule and call him back with appointment date and time. He is agreeable.

## 2014-01-15 NOTE — Telephone Encounter (Signed)
Spoke with Manuela Schwartz at [phone] to get patient an interpreter. Manuela Schwartz states that she was contacted by the office who handles our interpreter needs. Charlean Merl is the interpreter that has been assigned to come today for patient's appointment.  Cc: Milford Cage, FNP   Routing to provider for final review. Patient agreeable to disposition. Will close encounter

## 2014-01-15 NOTE — Telephone Encounter (Signed)
Spoke with patient's husband Marchia Bond. Appointment scheduled today at 12:45pm with Milford Cage, [place]. He is agreeable. Patient will need interpreter for appointment. Called over to [phone] as listed in chart for interpreter recording advised to call to [phone] for scheduling. Left message on voicemail to return call for scheduling as will need interpreter today for a patient.

## 2014-01-15 NOTE — Patient Instructions (Signed)
Infeccin urinaria  (Urinary Tract Infection)  La infeccin urinaria puede ocurrir en Clinical cytogeneticist del tracto urinario. El tracto urinario es un sistema de drenaje del cuerpo por el que se eliminan los desechos y el exceso de [place]. El tracto urinario est formado por dos riones, dos urteres, la vejiga y Geologist, engineering. Los riones son rganos que tienen forma de frijol. Cada rin tiene aproximadamente el tamao del puo. Estn situados debajo de las [place], uno a cada lado de la columna vertebral CAUSAS  La causa de la infeccin son los microbios, que son organismos microscpicos, que incluyen hongos, virus, y bacterias. Estos organismos son tan pequeos que slo pueden verse a travs del microscopio. Las bacterias son los microorganismos que ms comnmente causan infecciones urinarias.  SNTOMAS  Los sntomas pueden variar segn la edad y el sexo del paciente y por la ubicacin de la infeccin. Los sntomas en las mujeres jvenes incluyen la necesidad frecuente e intensa de orinar y una sensacin dolorosa de ardor en la vejiga o en la uretra durante la miccin. Las mujeres y los hombres mayores podrn sentir cansancio, temblores y debilidad y Arts development officer musculares y Social research officer, government abdominal. Si tiene [place], puede significar que la infeccin est en los riones. Otros sntomas son dolor en la espalda o en los lados debajo de las [place], nuseas y vmitos.  DIAGNSTICO  Para diagnosticar una infeccin urinaria, el mdico le preguntar acerca de sus sntomas. [employer] una [place] de [place]. La muestra de orina se analiza para Hydrographic surveyor bacterias y glbulos blancos de Herbalist. Los glbulos blancos se forman en el organismo para ayudar a Radio broadcast assistant las infecciones.  TRATAMIENTO  Por lo general, las infecciones urinarias pueden tratarse con medicamentos. Debido a que la [employer] de las infecciones son causadas por bacterias, por lo general pueden tratarse con antibiticos. La eleccin del  antibitico y la duracin del tratamiento depender de sus sntomas y el tipo de bacteria causante de la infeccin.  INSTRUCCIONES PARA EL CUIDADO EN EL HOGAR   Si le recetaron antibiticos, tmelos exactamente como su mdico le indique. Termine el medicamento aunque se sienta mejor despus de haber tomado slo algunos.  Beba gran cantidad de lquido para mantener la orina de tono claro o color amarillo plido.  Evite la cafena, el t y las bebidas gaseosas. Estas sustancias irritan la vejiga.  Vaciar la vejiga con frecuencia. Evite retener la orina durante largos perodos.  Vace la vejiga antes y despus de Clinical biochemist.  Despus de mover el intestino, las mujeres deben higienizarse la regin perineal desde adelante hacia atrs. Use slo un papel tissue por vez. SOLICITE ATENCIN MDICA SI:   Siente dolor en la espalda.  Le sube la fiebre.  Los sntomas no mejoran luego de 3 das. SOLICITE ATENCIN MDICA DE INMEDIATO SI:   Siente dolor intenso en la espalda o en la zona inferior del abdomen.  Comienza a sentir escalofros.  Tiene nuseas o vmitos.  Tiene una sensacin continua de quemazn o molestias al [employer]. ASEGRESE DE QUE:   Comprende estas instrucciones.  Controlar su enfermedad.  Solicitar ayuda de inmediato si no mejora o empeora. Document Released: 02/11/2005 Document Revised: 01/27/2012 [facility] Patient Information 2015 [place]. This information is not intended to replace advice given to you by your health care provider. Make sure you discuss any questions you have with your health care provider. Vaginitis monilisica (Monilial Vaginitis) La vaginitis es una inflamacin (irritacin, hinchazn) de la vagina y la vulva. Esta  no es una enfermedad de transmisin sexual.  CAUSAS Este tipo de vaginitis lo causa un hongo (candida) que normalmente se encuentra en la vagina. El hongo candida se ha desarrollado hasta el punto de ocasionar  problemas en el equilibrio qumico. SNTOMAS  Secrecin vaginal espesa y blanca.  Hinchazn, picazn, enrojecimiento e inflamacin de la vagina y en algunos casos de los labios vaginales (vulva).  Ardor o dolor al [employer].  Dolor en [place]. DIAGNSTICO Los factores que favorecen la vaginitis moniliasica son:  Kyla Balzarine de virginidad y postmenopusicas.  Embarazo.  Infecciones.  Sentir cansancio, estar enferma o estresada, especialmente si ya ha sufrido este problema en el pasado.  Diabetes Buen control ayudar a disminur la probabilidad.  Pldoras anticonceptivas  Ropa interior [place].  El uso de espumas de bao, aerosoles femeninos duchas vaginales o tampones con desodorante.  Algunos antibiticos (medicamentos que destruyen grmenes).  Si contrae alguna enfermedad puede sufrir recurrencias espordicas. [place] profesional que lo asiste prescribir medicamentos.  Hay diferentes tipos de cremas y supositorios vaginales que tratan especficamente la vaginitis monilisica. Para infecciones por hongos recurrentes, utilice un supositorio o crema en la vagina dos veces por semana, o segn se le indique.  Tambin podrn utilizarse cremas con corticoides o anti monilisicas para la picazn o la irritacin de la vulva. Consulte con el profesional que la asiste.  Si la crema no da resultado, podr aplicarse en la vagina una solucin con azul de metileno.  El consumo de yogur puede prevenir este tipo de vaginitis. INSTRUCCIONES [place] todos los medicamentos tal como se le indic.  No mantenga relaciones sexuales hasta que el tratamiento se haya completado, o segn las indicaciones del profesional que la asiste.  Tome baos de asiento tibios.  No se aplique duchas vaginales.  No utilice tampones, especialmente los perfumados.  Use ropa interior de algodn  [employer] pantalones ajustados y las medias tipo  panty.  Comunique a sus compaeros sexuales que sufre una infeccin por hongos. Ellos deben concurrir para un control mdico si tienen sntomas como una urticaria leve o picazn.  Sus compaeros sexuales deben tratarse tambin si la infeccin es difcil de Radiographer, therapeutic.  Practique el sexo seguro - use condones  Algunos medicamentos vaginales ocasionan fallas en los condones de ltex. Los medicamentos vaginales que pueden daar los condones son:  Building services engineer cleocina  Butoconazole (Femstat)  Terconazole (Terazol) supositorios vaginales  Miconazole (Monistat) (es un medicamento de venta libre) SOLICITE ATENCIN MDICA SI:  [place] Session tiene una temperatura oral de ms de 38,9 C (102 F).  Si la infeccin empeora luego de 2 das de tratamiento.  Si la infeccin no mejora luego de 3 das de tratamiento.  Aparecen ampollas en o alrededor de la vagina.  Si aparece una hemorragia vaginal y no es el momento del perodo.  Siente dolor al [employer].  Presenta problemas intestinales.  Tiene dolor durante las [employer]. Document Released: 02/11/2005 Document Revised: 07/27/2011 [facility] Patient Information 2015 [place]. This information is not intended to replace advice given to you by your health care provider. Make sure you discuss any questions you have with your health care provider.

## 2014-01-15 NOTE — Progress Notes (Signed)
Encounter reviewed by Dr. Filimon Miranda Silva.  

## 2014-01-16 LAB — URINALYSIS, MICROSCOPIC ONLY
CRYSTALS: NONE SEEN
Casts: NONE SEEN

## 2014-01-16 LAB — URINE CULTURE: Colony Count: 9000

## 2014-02-05 ENCOUNTER — Encounter: Payer: Self-pay | Admitting: Obstetrics and Gynecology

## 2014-02-05 ENCOUNTER — Ambulatory Visit (INDEPENDENT_AMBULATORY_CARE_PROVIDER_SITE_OTHER): Payer: BC Managed Care – PPO | Admitting: Obstetrics and Gynecology

## 2014-02-05 VITALS — BP 132/78 | HR 72 | Wt 201.0 lb

## 2014-02-05 DIAGNOSIS — R3129 Other microscopic hematuria: Secondary | ICD-10-CM

## 2014-02-05 DIAGNOSIS — N951 Menopausal and female climacteric states: Secondary | ICD-10-CM

## 2014-02-05 DIAGNOSIS — N952 Postmenopausal atrophic vaginitis: Secondary | ICD-10-CM

## 2014-02-05 MED ORDER — ESTRADIOL 0.1 MG/GM VA CREA: TOPICAL_CREAM | VAGINAL | Status: DC

## 2014-02-05 MED ORDER — ESTRADIOL 1 MG PO TABS: 1.0000 mg | ORAL_TABLET | Freq: Every day | ORAL | Status: DC

## 2014-02-05 NOTE — Patient Instructions (Signed)
Please start the use of the vaginal estrogen cream.   I will see you back in one month.

## 2014-02-05 NOTE — Progress Notes (Signed)
GYNECOLOGY  VISIT   HPI: 43 y.o.   Married  Hispanic  female   G1P1001 with Patient's last menstrual period was 10/13/2013.   here for   Pelvic pain follow up Feeling better overall.   Having some pelvic pressure bilaterally.  Feels inflamed.  Bowel movement once a day or once every 2 days.  Some dysuria but it feels better.  No blood in the urine.   Having migraines.  Having headaches almost every day.  Used to have headaches only when she had her menses in the past. Having hot flashes at night, not too strong.   Patient has not yet returned to sexual activity or exercise.   Urine dip - trace RBCs.   GYNECOLOGIC HISTORY: Patient's last menstrual period was 10/13/2013. Contraception:  no  Menopausal hormone therapy: estradiol patches        OB History   Grav Para Term Preterm Abortions TAB SAB Ect Mult Living   1 1 1       1          Patient Active Problem List   Diagnosis Date Noted  . Proteinuria 10/05/2013    Past Medical History  Diagnosis Date  . PONV (postoperative nausea and vomiting)   . Medical history non-contributory     Past Surgical History  Procedure Laterality Date  . Cyst excision Bilateral 1995    under both arms  . Cesarean section  1996  . Cholecystectomy    . Cholecystectomy    . Robotic assisted total hysterectomy with bilateral salpingo oopherectomy Bilateral 11/21/2013    Procedure: ROBOTIC ASSISTED TOTAL HYSTERECTOMY WITH BILATERAL SALPINGO OOPHORECTOMY;  Surgeon: Jamey Reas de Berton Lan, MD;  Location: [facility];  Service: Gynecology;  Laterality: Bilateral;  . Cystoscopy N/A 11/21/2013    Procedure: CYSTOSCOPY;  Surgeon: Jamey Reas de Berton Lan, MD;  Location: [facility];  Service: Gynecology;  Laterality: N/A;    Current Outpatient Prescriptions  Medication Sig Dispense Refill  . estradiol (MINIVELLE) 0.1 MG/24HR patch Place 1 patch (0.1 mg total) onto the skin 2 (two) times a week. Apply anywhere on lower abdomen.   Change patch twice a week.  8 patch  3  . ferrous sulfate 325 (65 FE) MG tablet Take one (325 mg) by mouth twice a day for anemia.  60 tablet  1  . fluticasone (FLONASE) 50 MCG/ACT nasal spray Place 1 spray into both nostrils daily.      Marland Kitchen ibuprofen (ADVIL,MOTRIN) 600 MG tablet Take 1 tablet (600 mg total) by mouth every 6 (six) hours as needed (mild pain).  30 tablet  0   No current facility-administered medications for this visit.     ALLERGIES: Review of patient's allergies indicates no known allergies.  Family History  Problem Relation Age of Onset  . Diabetes Mother   . Hypertension Mother   . Hypertension Father     History   Social History  . Marital Status: Married    Spouse Name: N/A    Number of Children: N/A  . Years of Education: N/A   Occupational History  . Not on file.   Social History Main Topics  . Smoking status: Never Smoker   . Smokeless tobacco: Never Used  . Alcohol Use: 2.0 oz/week    4 drink(s) per week  . Drug Use: No  . Sexual Activity: Yes    Partners: Male    Birth Control/ Protection: Surgical     Comment: R-TAH/BSO  Other Topics Concern  . Not on file   Social History Narrative  . No narrative on file    ROS:  Pertinent items are noted in HPI.  PHYSICAL EXAMINATION:    BP 132/78  Pulse 72  Wt 201 lb (91.173 kg)  LMP 10/13/2013     General appearance: alert, cooperative and appears stated age   Abdomen: incisions intact, soft, non-tender; no masses,  no organomegaly   Pelvic: External genitalia:  no lesions              Urethra:  normal appearing urethra with no masses, tenderness or lesions              Bartholins and Skenes: normal                 Vagina: normal appearing vagina with normal color and discharge, no lesions, cuff well healed.              Cervix:  absent                   Bimanual Exam:  Uterus:  absent                                     Adnexa:  normal adnexa in size, nontender and no masses                                      ASSESSMENT  Pelvic pressure.  Microscopic hematuria.  Possible atrophic vaginal symptoms.  Headaches.   PLAN  Will stop Minivelle and switch to Estradiol 1 mg daily.  See orders. May eventually need to increase to 2 mg daily.  Estrace vaginal cream.  See orders. See labs:  Yes.   Urine culture.  Return one month for a recheck.  OK to return to all normal activities.    An After Visit Summary was printed and given to the patient.  __25____ minutes face to face time of which over 50% was spent in counseling.

## 2014-02-05 NOTE — Telephone Encounter (Signed)
Routing to provider for final review. Patient agreeable to disposition. Will close encounter.     

## 2014-02-07 ENCOUNTER — Encounter: Payer: Self-pay | Admitting: Obstetrics and Gynecology

## 2014-02-07 LAB — URINE CULTURE
Colony Count: NO GROWTH
Organism ID, Bacteria: NO GROWTH

## 2014-03-02 ENCOUNTER — Telehealth: Payer: Self-pay | Admitting: Obstetrics and Gynecology

## 2014-03-02 NOTE — Telephone Encounter (Signed)
Note not needed pt reached

## 2014-03-07 ENCOUNTER — Ambulatory Visit (INDEPENDENT_AMBULATORY_CARE_PROVIDER_SITE_OTHER): Payer: BC Managed Care – PPO | Admitting: Obstetrics and Gynecology

## 2014-03-07 ENCOUNTER — Encounter: Payer: Self-pay | Admitting: Obstetrics and Gynecology

## 2014-03-07 VITALS — BP 120/80 | HR 84 | Ht 63.0 in | Wt 199.8 lb

## 2014-03-07 DIAGNOSIS — N951 Menopausal and female climacteric states: Secondary | ICD-10-CM

## 2014-03-07 MED ORDER — ESTRADIOL 0.1 MG/GM VA CREA: TOPICAL_CREAM | VAGINAL | Status: DC

## 2014-03-07 MED ORDER — EST ESTROGENS-METHYLTEST 0.625-1.25 MG PO TABS: 1.0000 | ORAL_TABLET | Freq: Every day | ORAL | Status: DC

## 2014-03-07 NOTE — Progress Notes (Signed)
Patient ID: Brittany Kelly, female   DOB: [birth year], 43 y.o.   MRN: [id number] GYNECOLOGY  VISIT   HPI: 43 y.o.   Married  Hispanic  female   G1P1001 with Patient's last menstrual period was 10/13/2013.   here for  4 week follow up visit. Spanish interpretor present.   Vaginal pressure is gone. No pelvic pain.  Using vaginal estrogen cream.   Using reduced oral Estradiol to 1/2 mg.  Had joint pain, headaches when took Estradiol 1 mg.  Still has some joint pain.  Having hot flashes at night but not every night.  Noted a decrease in her energy.   GYNECOLOGIC HISTORY: Patient's last menstrual period was 10/13/2013. Contraception:  Hysterectomy  Menopausal hormone therapy: Estradiol 1mg --taking 1/2 tablet daily, Estrace vaginal cream(ran out--needs new Rx)        OB History   Grav Para Term Preterm Abortions TAB SAB Ect Mult Living   1 1 1       1          There are no active problems to display for this patient.   Past Medical History  Diagnosis Date  . PONV (postoperative nausea and vomiting)   . Medical history non-contributory     Past Surgical History  Procedure Laterality Date  . Cyst excision Bilateral 1995    under both arms  . Cesarean section  1996  . Cholecystectomy    . Cholecystectomy    . Robotic assisted total hysterectomy with bilateral salpingo oopherectomy Bilateral 11/21/2013    Procedure: ROBOTIC ASSISTED TOTAL HYSTERECTOMY WITH BILATERAL SALPINGO OOPHORECTOMY;  Surgeon: Jamey Reas de Berton Lan, MD;  Location: [facility];  Service: Gynecology;  Laterality: Bilateral;  . Cystoscopy N/A 11/21/2013    Procedure: CYSTOSCOPY;  Surgeon: Jamey Reas de Berton Lan, MD;  Location: [facility];  Service: Gynecology;  Laterality: N/A;    Current Outpatient Prescriptions  Medication Sig Dispense Refill  . estradiol (ESTRACE) 1 MG tablet Take 1 tablet (1 mg total) by mouth daily.  30 tablet  2  . fluticasone (FLONASE) 50 MCG/ACT nasal spray Place  1 spray into both nostrils daily.      Marland Kitchen ibuprofen (ADVIL,MOTRIN) 600 MG tablet Take 1 tablet (600 mg total) by mouth every 6 (six) hours as needed (mild pain).  30 tablet  0  . estradiol (ESTRACE) 0.1 MG/GM vaginal cream Use 1/2 g vaginally every night for the first 2 weeks, then use 1/2 g vaginally two or three times per week as needed to maintain symptom relief.  42.5 g  1   No current facility-administered medications for this visit.     ALLERGIES: Review of patient's allergies indicates no known allergies.  Family History  Problem Relation Age of Onset  . Diabetes Mother   . Hypertension Mother   . Hypertension Father     History   Social History  . Marital Status: Married    Spouse Name: N/A    Number of Children: N/A  . Years of Education: N/A   Occupational History  . Not on file.   Social History Main Topics  . Smoking status: Never Smoker   . Smokeless tobacco: Never Used  . Alcohol Use: 2.0 oz/week    4 drink(s) per week  . Drug Use: No  . Sexual Activity: Yes    Partners: Male    Birth Control/ Protection: Surgical     Comment: R-TAH/BSO   Other Topics Concern  . Not on file  Social History Narrative  . No narrative on file    ROS:  Pertinent items are noted in HPI.  PHYSICAL EXAMINATION:    BP 120/80  Pulse 84  Ht 5\' 3"  (1.6 m)  Wt 199 lb 12.8 oz (90.629 kg)  BMI 35.40 kg/m2  LMP 10/13/2013     General appearance: alert, cooperative and appears stated age Lungs: clear to auscultation bilaterally Heart: regular rate and rhythm Abdomen: soft, non-tender; no masses,  no organomegaly No abnormal inguinal nodes palpated  Pelvic: External genitalia:  no lesions              Urethra:  normal appearing urethra with no masses, tenderness or lesions              Bartholins and Skenes: normal                 Vagina: normal appearing vagina with normal color and discharge, no lesions, cuff intact.               Cervix:  absent                    Bimanual Exam:  Uterus:  absent                                      Adnexa:  absent                                      ASSESSMENT  Menopausal symptoms.  Vaginal/pelvic symptoms improved with local estrogen cream.  PLAN  Will stop Estradiol and try Estratest hs.  See orders.  Continue Estrace cream 1.2 gm pv at hs 2 times per week. OK to resume all normal activities including intercourse. Handout on menopause to patient (English.) Recheck in 3 months.   An After Visit Summary was printed and given to the patient.  _15_____ minutes face to face time of which over 50% was spent in counseling.

## 2014-03-09 ENCOUNTER — Ambulatory Visit: Payer: BC Managed Care – PPO | Admitting: Obstetrics and Gynecology

## 2014-03-19 ENCOUNTER — Encounter: Payer: Self-pay | Admitting: Obstetrics and Gynecology

## 2014-04-27 ENCOUNTER — Telehealth: Payer: Self-pay | Admitting: Obstetrics and Gynecology

## 2014-04-27 NOTE — Telephone Encounter (Signed)
Hi Brittany Kelly, I will forward this message on to the triage nurse to call you and schedule an appointment. ===View-only below this line===   ----- Message -----    From: Brittany Kelly    Sent: 04/27/2014 10:53 AM EST      To: Patient Appointment Schedule Request Mailing List Subject: Appointment Request  Appointment Request From: Brittany Kelly  With Provider: Arloa Koh, MD [facility]  Preferred Date Range: From 04/30/2014 To 05/16/2014  Preferred Times: Any  Reason for visit: Office Visit  Comments: I will like to discuss my physical conditions since  taking the new medications .  We can do the appointment January as well.

## 2014-04-27 NOTE — Telephone Encounter (Signed)
Spoke with patient's husband Marchia Bond (okay per ROI). Marchia Bond states that patient would like to come in to see Dr.Silva to discuss her dosage of medication. "She feels about the same and just wants to talk to her to see if there is anything that can be changed to help." Offered appointment on 12/23 but husband declines due to being close to the holidays. Requests appointment the first or second week in January. Appointment scheduled for 1/4 at 1pm with Dr.Silva. Agreeable to date and time.   Routing to provider for final review. Patient agreeable to disposition. Will close encounter

## 2014-05-21 ENCOUNTER — Encounter: Payer: Self-pay | Admitting: Obstetrics and Gynecology

## 2014-05-21 ENCOUNTER — Ambulatory Visit (INDEPENDENT_AMBULATORY_CARE_PROVIDER_SITE_OTHER): Payer: BC Managed Care – PPO | Admitting: Obstetrics and Gynecology

## 2014-05-21 VITALS — BP 120/74 | HR 80 | Ht 63.0 in | Wt 203.6 lb

## 2014-05-21 DIAGNOSIS — R519 Headache, unspecified: Secondary | ICD-10-CM

## 2014-05-21 DIAGNOSIS — R140 Abdominal distension (gaseous): Secondary | ICD-10-CM

## 2014-05-21 DIAGNOSIS — N951 Menopausal and female climacteric states: Secondary | ICD-10-CM

## 2014-05-21 DIAGNOSIS — R51 Headache: Secondary | ICD-10-CM

## 2014-05-21 MED ORDER — ESTRADIOL 0.0375 MG/24HR TD PTTW: 1.0000 | MEDICATED_PATCH | TRANSDERMAL | Status: DC

## 2014-05-21 NOTE — Progress Notes (Signed)
Patient ID: Brittany Kelly, female   DOB: 1970-07-26, 44 y.o.   MRN: [id number] GYNECOLOGY  VISIT   HPI: 44 y.o.   Married  Hispanic  female   G1P1001 with Patient's last menstrual period was 10/13/2013.   here for consult regarding HRT medications.  Patient c/o increased headaches, mood swings, acid reflux, increased heartrate and abdominal bloating since beginning HRT. Taking Estratest 0.625/1.25 mg, 1/2 tablet.  When taking the whole dosage, had headaches.  Still does not feel well taking 1/2 of the pill.  Still has headache, mood swings, abdominal bloating.  Feels very distracted and irritated.   Wants to use a patch and do a lower dosage.  Wants to continue with vaginal estrogen - using 1/2 gram once weekly.  GYNECOLOGIC HISTORY: Patient's last menstrual period was 10/13/2013. Contraception:  hysterectomy  Menopausal hormone therapy: Estrogen/Testosterone .6251.25mg  (1/2 tab qd) and Estrace vag. cream        OB History    Gravida Para Term Preterm AB TAB SAB Ectopic Multiple Living   1 1 1       1          There are no active problems to display for this patient.   Past Medical History  Diagnosis Date  . PONV (postoperative nausea and vomiting)   . Medical history non-contributory     Past Surgical History  Procedure Laterality Date  . Cyst excision Bilateral 1995    under both arms  . Cesarean section  1996  . Cholecystectomy    . Cholecystectomy    . Robotic assisted total hysterectomy with bilateral salpingo oopherectomy Bilateral 11/21/2013    Procedure: ROBOTIC ASSISTED TOTAL HYSTERECTOMY WITH BILATERAL SALPINGO OOPHORECTOMY;  Surgeon: Jamey Reas de Berton Lan, MD;  Location: [facility];  Service: Gynecology;  Laterality: Bilateral;  . Cystoscopy N/A 11/21/2013    Procedure: CYSTOSCOPY;  Surgeon: Jamey Reas de Berton Lan, MD;  Location: [facility];  Service: Gynecology;  Laterality: N/A;    Current Outpatient Prescriptions  Medication Sig  Dispense Refill  . estradiol (ESTRACE) 0.1 MG/GM vaginal cream Use 1/2 g vaginally two or three times per week as needed to maintain symptom relief. 42.5 g 1  . estrogen-methylTESTOSTERone 0.625-1.25 MG per tablet Take 1 tablet by mouth daily. (Patient taking differently: Take 1 tablet by mouth daily. **Patient takes 1/2 tablet daily**) 30 tablet 3  . fluticasone (FLONASE) 50 MCG/ACT nasal spray Place 1 spray into both nostrils daily.    Marland Kitchen ibuprofen (ADVIL,MOTRIN) 600 MG tablet Take 1 tablet (600 mg total) by mouth every 6 (six) hours as needed (mild pain). 30 tablet 0  . PAZEO 0.7 % SOLN Place 1 drop into both eyes daily.  2   No current facility-administered medications for this visit.     ALLERGIES: Review of patient's allergies indicates no known allergies.  Family History  Problem Relation Age of Onset  . Diabetes Mother   . Hypertension Mother   . Hypertension Father     History   Social History  . Marital Status: Married    Spouse Name: N/A    Number of Children: N/A  . Years of Education: N/A   Occupational History  . Not on file.   Social History Main Topics  . Smoking status: Never Smoker   . Smokeless tobacco: Never Used  . Alcohol Use: 2.4 oz/week    4 Not specified per week  . Drug Use: No  . Sexual Activity:    Partners:  Male    Birth Control/ Protection: Surgical     Comment: R-TAH/BSO   Other Topics Concern  . Not on file   Social History Narrative    ROS:  Pertinent items are noted in HPI.  PHYSICAL EXAMINATION:    BP 120/74 mmHg  Pulse 80  Ht 5\' 3"  (1.6 m)  Wt 203 lb 9.6 oz (92.352 kg)  BMI 36.08 kg/m2  LMP 10/13/2013     General appearance: alert, cooperative and appears stated age Lungs: clear to auscultation bilaterally Heart: regular rate and rhythm Abdomen: soft, non-tender; no masses,  no organomegaly  ASSESSMENT  Menopausal symptoms. Abdominal bloating.  Mood swings.  Headaches.   PLAN  Stop Estratest.  Switch to  Minivelle 0.0375 mg twice weekly.  Continue with vaginal Estrace cream.  Follow up in 6 months for annual exam and prn.    An After Visit Summary was printed and given to the patient.  __15____ minutes face to face time of which over 50% was spent in counseling.

## 2014-05-22 ENCOUNTER — Other Ambulatory Visit: Payer: Self-pay | Admitting: Obstetrics and Gynecology

## 2014-05-22 NOTE — Telephone Encounter (Signed)
Please confirm that the patient is using her Estrace cream properly. The dosage is 1/2 gram in the vagina two nights per week. I have given her the refills requested.  [place]

## 2014-05-22 NOTE — Telephone Encounter (Signed)
Medication refill request: Estrace Vaginal Cream Last AEX:  10/18/13 Next AEX: 10/24/14 Last MMG (if hormonal medication request): 10/25/13 BIRADS1:neg Refill authorized: 1021/15 #42.5g/1R. Today #42.5g/1R?

## 2014-05-23 ENCOUNTER — Telehealth: Payer: Self-pay | Admitting: Obstetrics and Gynecology

## 2014-05-23 NOTE — Telephone Encounter (Signed)
Spoke with Lisabeth Pick at [facility] who states that Estradiol will need PA.

## 2014-05-23 NOTE — Telephone Encounter (Signed)
Patient's pharmacy calling requesting clarification of the RX for estradiol or minivelle.

## 2014-05-25 NOTE — Telephone Encounter (Signed)
Calling [facility]. Phone is busy. Attempting to obtain prior authorization

## 2014-05-28 NOTE — Telephone Encounter (Signed)
Call again to [facility]. Busy signal.

## 2014-05-29 NOTE — Telephone Encounter (Signed)
Call again to [facility]. Busy signal.

## 2014-05-31 MED ORDER — ESTROGENS, CONJUGATED 0.625 MG/GM VA CREA: TOPICAL_CREAM | VAGINAL | Status: DC

## 2014-05-31 NOTE — Telephone Encounter (Signed)
Outgoing call to [facility] for prior authorization for estrace cream.  Prior authorization is denied because patient has not tried and failed any alternatives. Cost for estrace cream per husband, Marchia Bond, okay to speak with per designated party release form is greater than $400.00.   Advised can order premarin cream for patient to try. Covered for patient for $50.00 for 30 gram tube. Advised directions are the same. Ensured that patient be aware to apply 1/2 gram only, as this tube should last approximately 3 months.  He is agreeable to instructions and will advise patient. Advised if patient has questions, to please have her call office and can use language line for instructions. Marchia Bond assures me that he will assist.  Advised to call back with any problems with this cream and if so, can request alternative, Estrace be covered.   Order for premarin sent.  Routing to provider for final review. Patient agreeable to disposition. Will close encounter

## 2014-05-31 NOTE — Telephone Encounter (Signed)
Call again to [employer], busy signal

## 2014-06-08 ENCOUNTER — Ambulatory Visit: Payer: BC Managed Care – PPO | Admitting: Obstetrics and Gynecology

## 2014-06-25 NOTE — Telephone Encounter (Signed)
Late entry for  Karen Chafe, RN at 05/31/2014 11:59 AM     Status: Signed       Expand All Collapse All   Outgoing call to [facility] for prior authorization for estrace cream.  Prior authorization is denied because patient has not tried and failed any alternatives. Cost for estrace cream per husband, Marchia Bond, okay to speak with per designated party release form is greater than $400.00.   Advised can order premarin cream for patient to try. Covered for patient for $50.00 for 30 gram tube. Advised directions are the same. Ensured that patient be aware to apply 1/2 gram only, as this tube should last approximately 3 months.  He is agreeable to instructions and will advise patient. Advised if patient has questions, to please have her call office and can use language line for instructions. Marchia Bond assures me that he will assist.  Advised to call back with any problems with this cream and if so, can request alternative, Estrace be covered.   Order for premarin sent.  Routing to provider for final review. Patient agreeable to disposition. Will close encounter

## 2014-07-25 ENCOUNTER — Telehealth: Payer: Self-pay | Admitting: Obstetrics and Gynecology

## 2014-07-25 NOTE — Telephone Encounter (Signed)
Please send recent labs to [facility] Fax [phone] patient is at the office right now to be seen.

## 2014-07-25 NOTE — Telephone Encounter (Signed)
Labs faxed thru [facility] per request.

## 2014-08-17 ENCOUNTER — Other Ambulatory Visit: Payer: Self-pay | Admitting: Obstetrics and Gynecology

## 2014-08-17 NOTE — Telephone Encounter (Signed)
Medication refill request: Vivelle-Dot. Patient is on [place]  Last AEX:  10/18/13 Dr. Quincy Simmonds Next AEX: 10/24/14 Dr. Quincy Simmonds Last MMG (if hormonal medication request): 10/25/13 BIRADS1:neg Refill authorized: 05/21/14 [place] #12patch/1R.

## 2014-10-24 ENCOUNTER — Other Ambulatory Visit: Payer: Self-pay | Admitting: Obstetrics and Gynecology

## 2014-10-24 ENCOUNTER — Encounter: Payer: Self-pay | Admitting: Obstetrics and Gynecology

## 2014-10-24 ENCOUNTER — Ambulatory Visit (INDEPENDENT_AMBULATORY_CARE_PROVIDER_SITE_OTHER): Payer: BLUE CROSS/BLUE SHIELD | Admitting: Obstetrics and Gynecology

## 2014-10-24 VITALS — BP 118/80 | HR 80 | Ht 64.0 in | Wt 182.4 lb

## 2014-10-24 DIAGNOSIS — Z01419 Encounter for gynecological examination (general) (routine) without abnormal findings: Secondary | ICD-10-CM

## 2014-10-24 DIAGNOSIS — Z1231 Encounter for screening mammogram for malignant neoplasm of breast: Secondary | ICD-10-CM

## 2014-10-24 MED ORDER — ESTRADIOL 0.0375 MG/24HR TD PTTW: MEDICATED_PATCH | TRANSDERMAL | Status: DC

## 2014-10-24 MED ORDER — ESTROGENS, CONJUGATED 0.625 MG/GM VA CREA: TOPICAL_CREAM | VAGINAL | Status: DC

## 2014-10-24 NOTE — Patient Instructions (Signed)

## 2014-10-24 NOTE — Progress Notes (Signed)
Patient ID: Brittany Kelly, female   DOB: [birth year], 44 y.o.   MRN: [id number] 44 y.o. G34P1001 [name] here for annual exam.   Interpretor present for entire visit.   Using patch one a week instead of twice a week.  Still with some hot flashes but can handle it. Had swelling and fatigue with twice a week usage. Happy with this dosing. Puts the patch back on if she starts to have hot flashes.   Using Premarin cream periodically when feels inflamed.  LLQ swelling and umbilical pinching.  BMs every day. No pain with urination.  Can have some urethral burning at times.  Lost 25 pounds.  Exercising.   Had labs with PCP last month. Normal cholesterol.  Patient's last menstrual period was 10/13/2013.          Sexually active: Yes.    The current method of family planning is Hysterectomy.    Exercising: Yes.    work out, cardio Smoker:  no  Health Maintenance: Pap:  10-18-2013  Pap and HPV Neg History of abnormal Pap:  no MMG:  BI RADS 1 Benign -   June 2015                         Colonoscopy:  N/A BMD:      N/A TDaP:  ? Screening Labs:  [facility] last month - Urine today: Negative     reports that she has never smoked. She has never used smokeless tobacco. She reports that she drinks about 2.4 oz of alcohol per week. She reports that she does not use illicit drugs.  Past Medical History  Diagnosis Date  . PONV (postoperative nausea and vomiting)   . Medical history non-contributory     Past Surgical History  Procedure Laterality Date  . Cyst excision Bilateral 1995    under both arms  . Cesarean section  1996  . Cholecystectomy    . Cholecystectomy    . Robotic assisted total hysterectomy with bilateral salpingo oopherectomy Bilateral 11/21/2013    Procedure: ROBOTIC ASSISTED TOTAL HYSTERECTOMY WITH BILATERAL SALPINGO OOPHORECTOMY;  Surgeon: Jamey Reas de Berton Lan, MD;  Location: [facility];  Service: Gynecology;  Laterality: Bilateral;  .  Cystoscopy N/A 11/21/2013    Procedure: CYSTOSCOPY;  Surgeon: Jamey Reas de Berton Lan, MD;  Location: [facility];  Service: Gynecology;  Laterality: N/A;    Current Outpatient Prescriptions  Medication Sig Dispense Refill  . conjugated estrogens (PREMARIN) vaginal cream Use 1/2 gram vaginally twice per week at bedtime 42.5 g 1  . estradiol (VIVELLE-DOT) 0.0375 MG/24HR PLACE 1 PATCH ONTO SKIN TWICE A WEEK TO LOWER ABDOMEN,CHANGE TWICE A WEEK 8 patch 2  . fluticasone (FLONASE) 50 MCG/ACT nasal spray Place 1 spray into both nostrils daily.    Marland Kitchen ibuprofen (ADVIL,MOTRIN) 600 MG tablet Take 1 tablet (600 mg total) by mouth every 6 (six) hours as needed (mild pain). 30 tablet 0  . PAZEO 0.7 % SOLN Place 1 drop into both eyes daily.  2   No current facility-administered medications for this visit.    Family History  Problem Relation Age of Onset  . Diabetes Mother   . Hypertension Mother   . Hypertension Father     ROS:  Pertinent items are noted in HPI.  Otherwise, a comprehensive ROS was negative.  Exam:   LMP 10/13/2013    General appearance: alert, cooperative and appears stated age Head: Normocephalic, without  obvious abnormality, atraumatic Neck: no adenopathy, supple, symmetrical, trachea midline and thyroid normal to inspection and palpation Lungs: clear to auscultation bilaterally Breasts: normal appearance, no masses or tenderness, Inspection negative, No nipple retraction or dimpling, No nipple discharge or bleeding, No axillary or supraclavicular adenopathy Heart: regular rate and rhythm Abdomen: incisions intact, soft, non-tender; bowel sounds normal; no masses,  no organomegaly Extremities: extremities normal, atraumatic, no cyanosis or edema Skin: Skin color, texture, turgor normal. No rashes or lesions Lymph nodes: Cervical, supraclavicular, and axillary nodes normal. No abnormal inguinal nodes palpated Neurologic: Grossly normal  Pelvic: External genitalia:  no  lesions              Urethra:  normal appearing urethra with no masses, tenderness or lesions              Bartholins and Skenes: normal                 Vagina: normal appearing vagina with normal color and discharge, no lesions              Cervix: absent              Pap taken: No. Bimanual Exam:  Uterus:  uterus absent              Adnexa: normal adnexa and no mass, fullness, tenderness              Rectovaginal: Yes.  .  Confirms.              Anus:  normal sphincter tone, no lesions  Chaperone was present for exam.  Assessment:   Well woman visit with normal exam. Status post robotic total laparoscopic hysterectomy/BSO. ERT patient.   Plan: Yearly mammogram recommended after age 32.  Recommended self breast exam.  Pap and HR HPV as above. Discussed Calcium, Vitamin D, regular exercise program including cardiovascular and weight bearing exercise. Labs performed.  No..   See orders. Refills given on medications.  Yes.  .  See orders.  Vivelle Dot 0.0375 mg twice weekly.  Discussed proper dosing of medication and that a lower dosage can be used in the future is needed.   Premarin vaginal cream 1/2 gram twice weekly.   Discussed risks of estrogen use - DVT, PE, MI, stroke, breast cancer.  Follow up annually and prn.     After visit summary provided.

## 2014-11-01 ENCOUNTER — Ambulatory Visit
Admission: RE | Admit: 2014-11-01 | Discharge: 2014-11-01 | Disposition: A | Discharge status: Home / Self Care | Payer: BLUE CROSS/BLUE SHIELD | Source: Ambulatory Visit | Attending: Obstetrics and Gynecology | Admitting: Obstetrics and Gynecology

## 2014-11-01 DIAGNOSIS — Z1231 Encounter for screening mammogram for malignant neoplasm of breast: Secondary | ICD-10-CM

## 2015-09-21 ENCOUNTER — Emergency Department (HOSPITAL_BASED_OUTPATIENT_CLINIC_OR_DEPARTMENT_OTHER)
Admission: EM | Admit: 2015-09-21 | Discharge: 2015-09-22 | Disposition: A | Discharge status: Home / Self Care | Payer: BLUE CROSS/BLUE SHIELD | Attending: Emergency Medicine | Admitting: Emergency Medicine

## 2015-09-21 ENCOUNTER — Encounter (HOSPITAL_BASED_OUTPATIENT_CLINIC_OR_DEPARTMENT_OTHER): Payer: Self-pay | Admitting: Emergency Medicine

## 2015-09-21 DIAGNOSIS — R110 Nausea: Secondary | ICD-10-CM

## 2015-09-21 DIAGNOSIS — N390 Urinary tract infection, site not specified: Secondary | ICD-10-CM | POA: Insufficient documentation

## 2015-09-21 DIAGNOSIS — R197 Diarrhea, unspecified: Secondary | ICD-10-CM | POA: Diagnosis not present

## 2015-09-21 DIAGNOSIS — R42 Dizziness and giddiness: Secondary | ICD-10-CM | POA: Diagnosis not present

## 2015-09-21 LAB — CBC WITH DIFFERENTIAL/PLATELET
BASOS ABS: 0 10*3/uL (ref 0.0–0.1)
Basophils Relative: 0 %
Eosinophils Absolute: 0.2 10*3/uL (ref 0.0–0.7)
Eosinophils Relative: 1 %
HEMATOCRIT: 38.1 % (ref 36.0–46.0)
HEMOGLOBIN: 13.4 g/dL (ref 12.0–15.0)
LYMPHS PCT: 16 %
Lymphs Abs: 1.8 10*3/uL (ref 0.7–4.0)
MCH: 29 pg (ref 26.0–34.0)
MCHC: 35.2 g/dL (ref 30.0–36.0)
MCV: 82.5 fL (ref 78.0–100.0)
MONO ABS: 0.5 10*3/uL (ref 0.1–1.0)
Monocytes Relative: 4 %
NEUTROS ABS: 9 10*3/uL — AB (ref 1.7–7.7)
NEUTROS PCT: 79 %
Platelets: 378 10*3/uL (ref 150–400)
RBC: 4.62 MIL/uL (ref 3.87–5.11)
RDW: 13.1 % (ref 11.5–15.5)
WBC: 11.5 10*3/uL — AB (ref 4.0–10.5)

## 2015-09-21 LAB — COMPREHENSIVE METABOLIC PANEL
ALBUMIN: 3.9 g/dL (ref 3.5–5.0)
ALK PHOS: 147 U/L — AB (ref 38–126)
ALT: 47 U/L (ref 14–54)
AST: 55 U/L — AB (ref 15–41)
Anion gap: 5 (ref 5–15)
BILIRUBIN TOTAL: 0.9 mg/dL (ref 0.3–1.2)
BUN: 17 mg/dL (ref 6–20)
CO2: 28 mmol/L (ref 22–32)
CREATININE: 0.76 mg/dL (ref 0.44–1.00)
Calcium: 8.8 mg/dL — ABNORMAL LOW (ref 8.9–10.3)
Chloride: 103 mmol/L (ref 101–111)
GFR calc Af Amer: >60 mL/min (ref >60)
GFR calc non Af Amer: >60 mL/min (ref >60)
GLUCOSE: 106 mg/dL — AB (ref 65–99)
Potassium: 3.9 mmol/L (ref 3.5–5.1)
Sodium: 136 mmol/L (ref 135–145)
TOTAL PROTEIN: 7.9 g/dL (ref 6.5–8.1)

## 2015-09-21 LAB — URINALYSIS, ROUTINE W REFLEX MICROSCOPIC
Bilirubin Urine: NEGATIVE
GLUCOSE, UA: NEGATIVE mg/dL
KETONES UR: NEGATIVE mg/dL
Nitrite: POSITIVE — AB
PH: 6.5 (ref 5.0–8.0)
PROTEIN: 30 mg/dL — AB
Specific Gravity, Urine: 1.013 (ref 1.005–1.030)

## 2015-09-21 LAB — URINE MICROSCOPIC-ADD ON

## 2015-09-21 LAB — LIPASE, BLOOD: LIPASE: 14 U/L (ref 11–51)

## 2015-09-21 MED ORDER — ONDANSETRON HCL 4 MG/2ML IJ SOLN
4.0000 mg | Freq: Once | INTRAMUSCULAR | Status: AC
  Administered 2015-09-21: 4 mg via INTRAVENOUS
  Filled 2015-09-21: qty 2

## 2015-09-21 MED ORDER — SODIUM CHLORIDE 0.9 % IV BOLUS (SEPSIS)
1000.0000 mL | Freq: Once | INTRAVENOUS | Status: AC
  Administered 2015-09-21: 1000 mL via INTRAVENOUS

## 2015-09-21 MED ORDER — ONDANSETRON HCL 4 MG PO TABS: 4.0000 mg | ORAL_TABLET | Freq: Four times a day (QID) | ORAL | Status: DC

## 2015-09-21 MED ORDER — GI COCKTAIL ~~LOC~~
30.0000 mL | Freq: Once | ORAL | Status: AC
Start: 2015-09-21 — End: 2015-09-21
  Administered 2015-09-21: 30 mL via ORAL
  Filled 2015-09-21: qty 30

## 2015-09-21 MED ORDER — CEFTRIAXONE SODIUM 1 G IJ SOLR
1.0000 g | Freq: Once | INTRAMUSCULAR | Status: AC
  Administered 2015-09-21: 1 g via INTRAVENOUS
  Filled 2015-09-21: qty 10

## 2015-09-21 MED ORDER — CIPROFLOXACIN HCL 500 MG PO TABS: 500.0000 mg | ORAL_TABLET | Freq: Two times a day (BID) | ORAL | Status: DC

## 2015-09-21 MED ORDER — KETOROLAC TROMETHAMINE 60 MG/2ML IM SOLN
60.0000 mg | Freq: Once | INTRAMUSCULAR | Status: AC
  Administered 2015-09-21: 60 mg via INTRAMUSCULAR
  Filled 2015-09-21: qty 2

## 2015-09-21 NOTE — ED Notes (Signed)
PA at bedside.

## 2015-09-21 NOTE — ED Notes (Signed)
Patient just recently came back from [place], the patient since coming back has felt dizzy, and having nausea and has several loose stools on the first day. She reports that she is having burning into her chest and her abdominal area generalized.  Patient reports that she had a lot of spice food while away

## 2015-09-21 NOTE — ED Provider Notes (Signed)
CSN: [id number]     Arrival date & time 09/21/15  1729 History   First MD Initiated Contact with Patient 09/21/15 1733     Chief Complaint  Patient presents with  . Dizziness   HPI  Ms. Caster is a 45 year old female with no significant past medical history presenting with general malaise, headache, dizziness, abdominal pain, nausea and diarrhea 2 days. Patient returned from [place] 2 days ago as well. She reports a generalized throbbing headache which she takes aleve for with some improvement in symptoms. She states that her dizziness is like a sensation of unsteadiness. Denies lightheadedness, room spinning or syncope. She states that she has a burning sensation in her stomach that radiates up to her throat. She reports a history of stomach issues and had her gallbladder removed years ago. She notes that she has prilosec and nexium at home which she is supposed to take daily but doesn't. She also endorses nausea but no episodes of vomiting. Her appetite has remained good and she ate lunch 2 hours PTA. She endorses multiple episodes of diarrhea since returning home. No blood in her stool. She endorses feeling warm but no documented fevers. She endorses chills. She denies other family members who went on the trip with similar symptoms. No other complaints today.   Past Medical History  Diagnosis Date  . PONV (postoperative nausea and vomiting)   . Medical history non-contributory    Past Surgical History  Procedure Laterality Date  . Cyst excision Bilateral 1995    under both arms  . Cesarean section  1996  . Cholecystectomy    . Cholecystectomy    . Robotic assisted total hysterectomy with bilateral salpingo oopherectomy Bilateral 11/21/2013    Procedure: ROBOTIC ASSISTED TOTAL HYSTERECTOMY WITH BILATERAL SALPINGO OOPHORECTOMY;  Surgeon: Jamey Reas de Berton Lan, MD;  Location: [facility];  Service: Gynecology;  Laterality: Bilateral;  . Cystoscopy N/A 11/21/2013    Procedure:  CYSTOSCOPY;  Surgeon: Jamey Reas de Berton Lan, MD;  Location: [facility];  Service: Gynecology;  Laterality: N/A;   Family History  Problem Relation Age of Onset  . Diabetes Mother   . Hypertension Mother   . Hypertension Father    Social History  Substance Use Topics  . Smoking status: Never Smoker   . Smokeless tobacco: Never Used  . Alcohol Use: 2.4 oz/week    4 Standard drinks or equivalent per week   OB History    Gravida Para Term Preterm AB TAB SAB Ectopic Multiple Living   _0 Review of Systems  All other systems reviewed and are negative.     Allergies  Review of patient's allergies indicates no known allergies.  Home Medications   Prior to Admission medications   Medication Sig Start Date End Date Taking? Authorizing Provider  ciprofloxacin (CIPRO) 500 MG tablet Take 1 tablet (500 mg total) by mouth every 12 (twelve) hours. 09/21/15   Enis Leatherwood, PA-C  conjugated estrogens (PREMARIN) vaginal cream Use 1/2 gram vaginally twice per week at bedtime 10/24/14   Nunzio Cobbs, MD  CONTRAVE 8-90 MG TB12  10/23/14   Historical Provider, MD  estradiol (VIVELLE-DOT) 0.0375 MG/24HR PLACE 1 PATCH ONTO SKIN TWICE A WEEK TO LOWER ABDOMEN,CHANGE TWICE A WEEK 10/24/14   Brook Oletta Lamas, MD  fluticasone [facility]) 50 MCG/ACT nasal spray Place 1 spray into both nostrils daily.  Historical Provider, MD  ibuprofen (ADVIL,MOTRIN) 600 MG tablet Take 1 tablet (600 mg total) by mouth every 6 (six) hours as needed (mild pain). 11/28/13   Megan Salon, MD  ondansetron (ZOFRAN) 4 MG tablet Take 1 tablet (4 mg total) by mouth every 6 (six) hours. 09/21/15   Jamilah Jean, PA-C  PAZEO 0.7 % SOLN Place 1 drop into both eyes daily. 05/13/14   Historical Provider, MD   BP 122/81 mmHg  Pulse 101  Temp(Src) 99.7 F (37.6 C) (Oral)  Resp 16  Ht _0  (1.676 m)  Wt 80.287 kg  BMI 28.58 kg/m2  SpO2 96%  LMP 10/13/2013 Physical Exam  Constitutional: She  is oriented to person, place, and time. She appears well-developed and well-nourished. No distress.  Nontoxic appearing  HENT:  Head: Normocephalic and atraumatic.  Mouth/Throat: Oropharynx is clear and moist. No oropharyngeal exudate.  Moist mucus membranes  Eyes: Conjunctivae are normal. Right eye exhibits no discharge. Left eye exhibits no discharge. No scleral icterus.  No nystagmus  Neck: Normal range of motion. Neck supple.  Cardiovascular: Normal rate, regular rhythm and normal heart sounds.   Pulmonary/Chest: Effort normal and breath sounds normal. No respiratory distress. She has no wheezes. She exhibits no tenderness.  Abdominal: Soft. Normal appearance. There is tenderness in the epigastric area. There is no rigidity, no rebound and no guarding.  Musculoskeletal: Normal range of motion.  Neurological: She is alert and oriented to person, place, and time. She has normal strength. No cranial nerve deficit or sensory deficit. Coordination normal.  Skin: Skin is warm and dry.  Psychiatric: She has a normal mood and affect. Her behavior is normal.  Nursing note and vitals reviewed.   ED Course  Procedures (including critical care time) Labs Review Labs Reviewed  CBC WITH DIFFERENTIAL/PLATELET - Abnormal; Notable for the following:    WBC 11.5 (*)    Neutro Abs 9.0 (*)    All other components within normal limits  COMPREHENSIVE METABOLIC PANEL - Abnormal; Notable for the following:    Glucose, Bld 106 (*)    Calcium 8.8 (*)    AST 55 (*)    Alkaline Phosphatase 147 (*)    All other components within normal limits  URINALYSIS, ROUTINE W REFLEX MICROSCOPIC (NOT AT [facility]) - Abnormal; Notable for the following:    APPearance CLOUDY (*)    Hgb urine dipstick MODERATE (*)    Protein, ur 30 (*)    Nitrite POSITIVE (*)    Leukocytes, UA LARGE (*)    All other components within normal limits  URINE MICROSCOPIC-ADD ON - Abnormal; Notable for the following:    Squamous Epithelial /  LPF 0-5 (*)    Bacteria, UA MANY (*)    All other components within normal limits  LIPASE, BLOOD    Imaging Review No results found. I have personally reviewed and evaluated these images and lab results as part of my medical decision-making.   EKG Interpretation None      MDM   Final diagnoses:  UTI (lower urinary tract infection)  Diarrhea, unspecified type  Nausea   45 year old female presenting with nausea, burning epigastric pain, diarrhea and dizziness x 2 days. Pt with recent trip to [place]. Afebrile and hemodynamically stable. Mildly tachycardic. Nontoxic appearing. Non-focal neuro exam. No meningeal signs. Abdomen with mild tenderness in the epigastrium. No peritoneal signs. Mild leukocytosis. Alk phos elevated which appears to be baseline. Mildly elevated AST. UA positive for UTI. Given 1  L bolus, rocephin, zofran, toradol and GI cocktail in ED. Pt reports significant improvement in burning sensation after GI cocktail and headache after toradol. Pt likely has viral illness from recent travel and UTI causing symptoms. Will discharge with ciprofloxacin to cover UTI and potential infectious diarrhea. Pt is to follow up with her PCP for recheck of AST in 1-2 weeks.  At this time there does not appear to be any evidence of an acute emergency medical condition and the patient appears stable for discharge with appropriate outpatient follow up. Diagnosis was discussed with patient who verbalizes understanding and is agreeable to discharge. Pt case discussed with Dr. Jeneen Rinks who agrees with my plan. Return precautions given in discharge paperwork and discussed with pt at bedside. Pt is stable for discharge.    Lahoma Crocker Thos Matsumoto, PA-C 09/21/15 2027  Tanna Furry, MD 09/23/15 [id number]

## 2015-09-21 NOTE — Discharge Instructions (Signed)
Infeccin urinaria  (Urinary Tract Infection)  La infeccin urinaria puede ocurrir en Clinical cytogeneticist del tracto urinario. El tracto urinario es un sistema de drenaje del cuerpo por el que se eliminan los desechos y el exceso de [place]. El tracto urinario est formado por dos riones, dos urteres, la vejiga y Geologist, engineering. Los riones son rganos que tienen forma de frijol. Cada rin tiene aproximadamente el tamao del puo. Estn situados debajo de las [place], uno a cada lado de la columna vertebral CAUSAS  La causa de la infeccin son los microbios, que son organismos microscpicos, que incluyen hongos, virus, y bacterias. Estos organismos son tan pequeos que slo pueden verse a travs del microscopio. Las bacterias son los microorganismos que ms comnmente causan infecciones urinarias.  SNTOMAS  Los sntomas pueden variar segn la edad y el sexo del paciente y por la ubicacin de la infeccin. Los sntomas en las mujeres jvenes incluyen la necesidad frecuente e intensa de orinar y una sensacin dolorosa de ardor en la vejiga o en la uretra durante la miccin. Las mujeres y los hombres mayores podrn sentir cansancio, temblores y debilidad y Arts development officer musculares y Social research officer, government abdominal. Si tiene [place], puede significar que la infeccin est en los riones. Otros sntomas son dolor en la espalda o en los lados debajo de las [place], nuseas y vmitos.  DIAGNSTICO  Para diagnosticar una infeccin urinaria, el mdico le preguntar acerca de sus sntomas. [employer] una [place] de [place]. La muestra de orina se analiza para Hydrographic surveyor bacterias y glbulos blancos de Herbalist. Los glbulos blancos se forman en el organismo para ayudar a Radio broadcast assistant las infecciones.  TRATAMIENTO  Por lo general, las infecciones urinarias pueden tratarse con medicamentos. Debido a que la [employer] de las infecciones son causadas por bacterias, por lo general pueden tratarse con antibiticos. La eleccin del  antibitico y la duracin del tratamiento depender de sus sntomas y el tipo de bacteria causante de la infeccin.  INSTRUCCIONES PARA EL CUIDADO EN EL HOGAR   Si le recetaron antibiticos, tmelos exactamente como su mdico le indique. Termine el medicamento aunque se sienta mejor despus de haber tomado slo algunos.  Beba gran cantidad de lquido para mantener la orina de tono claro o color amarillo plido.  Evite la cafena, el t y las bebidas gaseosas. Estas sustancias irritan la vejiga.  Vaciar la vejiga con frecuencia. Evite retener la orina durante largos perodos.  Vace la vejiga antes y despus de Clinical biochemist.  Despus de mover el intestino, las mujeres deben higienizarse la regin perineal desde adelante hacia atrs. Use slo un papel tissue por vez. SOLICITE ATENCIN MDICA SI:   Siente dolor en la espalda.  Le sube la fiebre.  Los sntomas no mejoran luego de 3 das. SOLICITE ATENCIN MDICA DE INMEDIATO SI:   Siente dolor intenso en la espalda o en la zona inferior del abdomen.  Comienza a sentir escalofros.  Tiene nuseas o vmitos.  Tiene una sensacin continua de quemazn o molestias al [employer]. ASEGRESE DE QUE:   Comprende estas instrucciones.  Controlar su enfermedad.  Solicitar ayuda de inmediato si no mejora o empeora.   Esta informacin no tiene Marine scientist el consejo del mdico. Asegrese de hacerle al mdico cualquier pregunta que tenga.   Document Released: 02/11/2005 Document Revised: 01/27/2012 Elsevier Interactive Patient Education 2016 [place], Adulto (Nausea, Adult)  La nusea es la sensacin de Tree surgeon en el estmago o de la necesidad de Training and development officer.  No constituye una preocupacin seria en s misma, pero puede ser un signo de problemas mdicos ms graves. Si empeora, puede provocar vmitos. Si aparecen vmitos, hay riesgo de deshidratacin.  CAUSES   Infecciones por virus.  Intoxicacin  alimentaria.  Medicamentos.  Embarazo.  Mareos por movimiento.  Cefaleas migraosas.  Estrs emocional.  Dolor intenso producido en Clinical cytogeneticist.  Intoxicacin por alcohol. INSTRUCCIONES PARA EL CUIDADO EN EL HOGAR   Debe hacer reposo.  Pida instrucciones especficas a su mdico con respecto a la rehidratacin.  Consuma cantidades pequeas de alimentos y tome sorbos de lquidos con ms frecuencia.  [employer] como le indic el mdico. SOLICITE ATENCIN MDICA SI:   No mejora, o [place], despus de 2 das de tratamiento.  Tiene cefalea. SOLICITE ATENCIN MDICA DE INMEDIATO SI:  Tiene fiebre.  Se desmaya.  Sigue vomitando u observa sangre en el vmito.  Se siente extremadamente dbil o deshidratado.  La materia fecal es negra o tiene [place].  Siente dolor intenso en el pecho o en el abdomen. ASEGRESE DE QUE:   Comprende estas instrucciones.  Controlar su enfermedad.  Solicitar ayuda de inmediato si no mejora o si empeora.   Esta informacin no tiene Marine scientist el consejo del mdico. Asegrese de hacerle al mdico cualquier pregunta que tenga.   Document Released: 05/04/2005 Document Revised: 01/27/2012 Elsevier Interactive Patient Education 2016 [place]  (Diarrhea) La diarrea consiste en evacuaciones intestinales frecuentes, blandas o acuosas. Puede hacerlo sentir dbil y deshidratado. La deshidratacin puede hacer que se sienta cansado, sediento, tener la boca seca y que haya disminucin de [place], que a menudo es de color amarillo oscuro. La diarrea es un signo de otro problema, generalmente una infeccin que no durar [employer]. En la [place], la diarrea dura tpicamente 2 a 3 das. Sin embargo, puede durar ms tiempo si se trata de un signo de algo ms serio. Es importante tratar la diarrea como lo indique su mdico para disminuir o prevenir futuros episodios de Clinical biochemist.  CAUSAS  Algunas  causas comunes son:   Infecciones gastrointestinales causadas por virus, bacterias o parsitos.  Intoxicacin alimentaria o alergias a los alimentos.  Ciertos medicamentos, como los antibiticos, quimioterapia y laxantes.  Edulcorantes artificiales y fructosa.  Los trastornos [employer]. INSTRUCCIONES PARA EL CUIDADO EN EL HOGAR   Asegure una adecuada ingesta de lquidos (hidratacin). Evite los lquidos que contengan azcares simples o las bebidas deportivas, los jugos de frutas, los productos derivados de la leche entera y [place]. Si bebe la cantidad suficiente de lquidos, la orina debe ser clara o amarillo plido. Una solucin de rehidratacin oral se puede comprar en las farmacias, en las tiendas minoristas y por Internet. Se puede preparar una solucin de rehidratacin oral casera con los siguientes ingredientes:   - cucharadita de sal.   cucharadita de bicarbonato.   de cucharadita de sal sustituta que contenga cloruro de potasio.  1  cucharada de azcar.  1l (34 onzas) de agua.  Ciertos alimentos y bebidas pueden aumentar la velocidad a la que el alimento se mueve a travs del tracto gastrointestinal (GI). Estos alimentos y bebidas deben evitarse e incluyen:  Bebidas alcohlicas y con cafena.  Alimentos ricos en fibra, como frutas y verduras, nueces, semillas, panes y cereales integrales.  Alimentos y bebidas endulzados con alcoholes de azcar, tales como xilitol, sorbitol, y manitol.  Algunos alimentos pueden ser bien tolerados y puede ayudar a espesar las heces, incluyendo:  Alimentos con almidn, como arroz, pan, pasta, cereales bajos en azcar, avena, smola de maz, papas al horno, galletas y panecillos.  Bananas.  Pur de [employer].  Agregue alimentos ricos en probiticos a la dieta del nio para ayudar a aumentar las bacterias saludables en el tracto gastrointestinal, como el yogur y productos lcteos fermentados.  Lvese bien las manos despus de cada  episodio de diarrea.  Tome slo medicamentos de venta libre o recetados, segn las indicaciones del [place] un bao caliente para ayudar a disminuir ardor o dolor por los episodios frecuentes de diarrea. SOLICITE ATENCIN MDICA DE INMEDIATO SI:   No puede retener los lquidos.  Tiene vmitos persistentes.  Lollie Marrow en la materia fecal, o las heces son negras y de aspecto alquitranado.  No hay emisin de [place] durante 6 a 8 horas o elimina una pequea cantidad de [place].  Tiene dolor abdominal que aumenta o se localiza.  Est muy mareado o se desvanece.  Sufre un dolor intenso de [place].  La diarrea empeora o no mejora.  Tiene fiebre o sntomas que persisten durante ms de 2 o 3 das.  Tiene fiebre y los sntomas empeoran de manera sbita. ASEGRESE DE QUE:   Comprende estas instrucciones.  Controlar su enfermedad.  Solicitar ayuda de inmediato si no mejora o si empeora.   Esta informacin no tiene Marine scientist el consejo del mdico. Asegrese de hacerle al mdico cualquier pregunta que tenga.   Document Released: 05/04/2005 Document Revised: 04/20/2012 Elsevier Interactive Patient Education [employer].

## 2015-09-23 ENCOUNTER — Encounter: Payer: Self-pay | Admitting: Obstetrics and Gynecology

## 2015-09-23 ENCOUNTER — Ambulatory Visit (INDEPENDENT_AMBULATORY_CARE_PROVIDER_SITE_OTHER): Payer: BLUE CROSS/BLUE SHIELD | Admitting: Obstetrics and Gynecology

## 2015-09-23 VITALS — BP 110/74 | HR 70 | Temp 99.3°F | Ht 64.0 in | Wt 180.2 lb

## 2015-09-23 DIAGNOSIS — R300 Dysuria: Secondary | ICD-10-CM | POA: Diagnosis not present

## 2015-09-23 DIAGNOSIS — R319 Hematuria, unspecified: Secondary | ICD-10-CM | POA: Diagnosis not present

## 2015-09-23 DIAGNOSIS — R5381 Other malaise: Secondary | ICD-10-CM | POA: Diagnosis not present

## 2015-09-23 LAB — COMPREHENSIVE METABOLIC PANEL
ALBUMIN: 3.8 g/dL (ref 3.6–5.1)
ALK PHOS: 197 U/L — AB (ref 33–115)
ALT: 112 U/L — AB (ref 6–29)
AST: 157 U/L — ABNORMAL HIGH (ref 10–35)
BILIRUBIN TOTAL: 0.7 mg/dL (ref 0.2–1.2)
BUN: 9 mg/dL (ref 7–25)
CALCIUM: 8.6 mg/dL (ref 8.6–10.2)
CO2: 28 mmol/L (ref 20–31)
Chloride: 99 mmol/L (ref 98–110)
Creat: 0.64 mg/dL (ref 0.50–1.10)
Glucose, Bld: 85 mg/dL (ref 65–99)
POTASSIUM: 4.8 mmol/L (ref 3.5–5.3)
Sodium: 136 mmol/L (ref 135–146)
TOTAL PROTEIN: 7.2 g/dL (ref 6.1–8.1)

## 2015-09-23 LAB — CBC WITH DIFFERENTIAL/PLATELET
BASOS PCT: 0 %
Basophils Absolute: 0 cells/uL (ref 0–200)
EOS ABS: 206 {cells}/uL (ref 15–500)
Eosinophils Relative: 2 %
HEMATOCRIT: 36.9 % (ref 35.0–45.0)
HEMOGLOBIN: 12.5 g/dL (ref 11.7–15.5)
LYMPHS ABS: 2472 {cells}/uL (ref 850–3900)
LYMPHS PCT: 24 %
MCH: 28.5 pg (ref 27.0–33.0)
MCHC: 33.9 g/dL (ref 32.0–36.0)
MCV: 84.2 fL (ref 80.0–100.0)
MONO ABS: 1133 {cells}/uL — AB (ref 200–950)
MPV: 9.1 fL (ref 7.5–12.5)
Monocytes Relative: 11 %
Neutro Abs: 6489 cells/uL (ref 1500–7800)
Neutrophils Relative %: 63 %
Platelets: 350 10*3/uL (ref 140–400)
RBC: 4.38 MIL/uL (ref 3.80–5.10)
RDW: 13.7 % (ref 11.0–15.0)
WBC: 10.3 10*3/uL (ref 3.8–10.8)

## 2015-09-23 LAB — POCT URINALYSIS DIPSTICK
BILIRUBIN UA: NEGATIVE
GLUCOSE UA: NEGATIVE
Ketones, UA: NEGATIVE
NITRITE UA: NEGATIVE
Protein, UA: NEGATIVE
Urobilinogen, UA: NEGATIVE
pH, UA: 5

## 2015-09-23 MED ORDER — IBUPROFEN 800 MG PO TABS: 800.0000 mg | ORAL_TABLET | Freq: Three times a day (TID) | ORAL | Status: DC | PRN

## 2015-09-23 MED ORDER — FAMOTIDINE 20 MG PO TABS: 20.0000 mg | ORAL_TABLET | Freq: Every day | ORAL | Status: DC

## 2015-09-23 MED ORDER — SULFAMETHOXAZOLE-TRIMETHOPRIM 800-160 MG PO TABS: 1.0000 | ORAL_TABLET | Freq: Two times a day (BID) | ORAL | Status: DC

## 2015-09-23 NOTE — Progress Notes (Signed)
Patient ID: Brittany Kelly, female   DOB: [birth year], 45 y.o.   MRN: [id number] GYNECOLOGY  VISIT   HPI: 45 y.o.   Married  Hispanic  female   [id number] with Patient's last menstrual period was 10/13/2013.   here for pelvic discomfort, dysuria, headache and temperature which began 09-20-15 when she returned from [place].  She was seen in the E.R. On 09-21-15 and treated for a UTI with Cipro.   WBC 11.5 with left shift.  Urine had too numerous WBC, many bacteria, and 6 - 30 RBCs.  Interpretor is present for the visit today except the examination.   4 days ago noted pain with urination and dyspepsia. Noted back pain, fever, headache, nausea, dizziness, and diarrhea.  Went to the ER on 09/21/15 and received Rocephin and Rx for Ciprofloxacin which she also started on 09/21/15. Took a GI cocktail. Taking Tylenol every 4 hours and is till having fever.  Diarrhea has stopped.  Pain with urination is less.   No one in family is ill.   PCP - Delight Stare   GYNECOLOGIC HISTORY: Patient's last menstrual period was 10/13/2013. Contraception:  Hysterectomy - robotic with BSO. Menopausal hormone therapy:  Vivelle-Dot 0/0375mg  and Premarin vaginal cream 1/2 gm twice weekly Last mammogram:  11-01-14 [facility]. Last pap smear:  10-18-2013 Pap and HPV Neg         OB History    Gravida Para Term Preterm AB TAB SAB Ectopic Multiple Living   1 1 1       1          There are no active problems to display for this patient.   Past Medical History  Diagnosis Date  . PONV (postoperative nausea and vomiting)   . Medical history non-contributory     Past Surgical History  Procedure Laterality Date  . Cyst excision Bilateral 1995    under both arms  . Cesarean section  1996  . Cholecystectomy    . Cholecystectomy    . Robotic assisted total hysterectomy with bilateral salpingo oopherectomy Bilateral 11/21/2013    Procedure: ROBOTIC ASSISTED TOTAL HYSTERECTOMY WITH BILATERAL  SALPINGO OOPHORECTOMY;  Surgeon: Jamey Reas de Berton Lan, MD;  Location: [facility];  Service: Gynecology;  Laterality: Bilateral;  . Cystoscopy N/A 11/21/2013    Procedure: CYSTOSCOPY;  Surgeon: Jamey Reas de Berton Lan, MD;  Location: [facility];  Service: Gynecology;  Laterality: N/A;    Current Outpatient Prescriptions  Medication Sig Dispense Refill  . ciprofloxacin (CIPRO) 500 MG tablet Take 1 tablet (500 mg total) by mouth every 12 (twelve) hours. 10 tablet 0  . conjugated estrogens (PREMARIN) vaginal cream Use 1/2 gram vaginally twice per week at bedtime 42.5 g 2  . CONTRAVE 8-90 MG TB12   1  . estradiol (VIVELLE-DOT) 0.0375 MG/24HR PLACE 1 PATCH ONTO SKIN TWICE A WEEK TO LOWER ABDOMEN,CHANGE TWICE A WEEK 8 patch 11  . fluticasone (FLONASE) 50 MCG/ACT nasal spray Place 1 spray into both nostrils daily.    Marland Kitchen ibuprofen (ADVIL,MOTRIN) 600 MG tablet Take 1 tablet (600 mg total) by mouth every 6 (six) hours as needed (mild pain). 30 tablet 0  . ondansetron (ZOFRAN) 4 MG tablet Take 1 tablet (4 mg total) by mouth every 6 (six) hours. 12 tablet 0  . PAZEO 0.7 % SOLN Place 1 drop into both eyes daily.  2  . XIIDRA 5 % SOLN Place 1 drop into both eyes daily.  6  No current facility-administered medications for this visit.     ALLERGIES: Review of patient's allergies indicates no known allergies.  Family History  Problem Relation Age of Onset  . Diabetes Mother   . Hypertension Mother   . Hypertension Father     Social History   Social History  . Marital Status: Married    Spouse Name: N/A  . Number of Children: N/A  . Years of Education: N/A   Occupational History  . Not on file.   Social History Main Topics  . Smoking status: Never Smoker   . Smokeless tobacco: Never Used  . Alcohol Use: 2.4 oz/week    4 Standard drinks or equivalent per week  . Drug Use: No  . Sexual Activity:    Partners: Male    Birth Control/ Protection: Surgical     Comment:  R-TAH/BSO   Other Topics Concern  . Not on file   Social History Narrative    ROS:  Pertinent items are noted in HPI.  PHYSICAL EXAMINATION:    BP 110/74 mmHg  Pulse 70  Temp(Src) 99.3 F (37.4 C) (Oral)  Ht 5\' 4"  (1.626 m)  Wt 180 lb 3.2 oz (81.738 kg)  BMI 30.92 kg/m2  LMP 10/13/2013    General appearance: alert, cooperative and appears stated age Head: Normocephalic, without obvious abnormality, atraumatic Lungs: clear to auscultation bilaterally Heart: regular rate and rhythm Abdomen: incision(s):Yes.  , _____robotic laparoscopic_______  soft,  Tender epigastric region, no masses,  no organomegaly Extremities: extremities normal, atraumatic, no cyanosis or edema Skin: Skin color, texture, turgor normal. No rashes or lesions No abnormal inguinal nodes palpated Neurologic: Grossly normal  Pelvic: External genitalia:  no lesions              Urethra:  normal appearing urethra with no masses, tenderness or lesions              Bartholins and Skenes: normal                 Vagina: normal appearing vagina with normal color and discharge, no lesions.  Tender over bladder.               Cervix: absent            Bimanual Exam:  Uterus:  uterus absent              Adnexa: no mass, fullness, tenderness         Chaperone was present for exam.  ASSESSMENT  Malaise.  Fever.  Headache.  UTI.  Hematuria.  Recent travel.  PLAN  Discussion of infection.  Switch abx to Bactrim DS. Urine micro and culture.  CBC and diff, CMP.  Start Pepcid 20 mg po q day.  Motrin 800 mg po q 8 hrs prn pain.  Return to ER or PCP for increased symptoms.     An After Visit Summary was printed and given to the patient.  __25____ minutes face to face time of which over 50% was spent in counseling.

## 2015-09-24 LAB — URINALYSIS, MICROSCOPIC ONLY
Bacteria, UA: NONE SEEN [HPF]
Casts: NONE SEEN [LPF]
Crystals: NONE SEEN [HPF]
RBC / HPF: NONE SEEN RBC/HPF (ref <=2)
Squamous Epithelial / HPF: NONE SEEN [HPF] (ref <=5)
Yeast: NONE SEEN [HPF]

## 2015-09-25 ENCOUNTER — Telehealth: Payer: Self-pay

## 2015-09-25 DIAGNOSIS — R7989 Other specified abnormal findings of blood chemistry: Secondary | ICD-10-CM

## 2015-09-25 DIAGNOSIS — R945 Abnormal results of liver function studies: Principal | ICD-10-CM

## 2015-09-25 LAB — URINE CULTURE
Colony Count: NO GROWTH
Organism ID, Bacteria: NO GROWTH

## 2015-09-25 NOTE — Telephone Encounter (Signed)
-----   Message from Nunzio Cobbs, MD sent at 09/24/2015 12:31 PM EDT ----- Please contact patient regarding her blood work from yesterday.  Her liver function has changed further and I would like for her to see her primary care provider.  I saw her yesterday for a UTI not responding to Ciprofloxacin. She developed a UTI and diarrhea after traveling.  I placed her on Bactrim.  I recommended Pepcid for gastritis symptoms. I recommended Motrin for her headaches.   Her final urine culture is pending.   Cc- Marisa Sprinkles

## 2015-09-25 NOTE — Telephone Encounter (Signed)
Spoke with patient's husband Marchia Bond, okay per ROI. Advised of message and results as seen below from [place]. He is agreeable. Reports the patient does not have a PCP and requests a referral. Advised referral will be placed and the patient will be contacted regarding appointment date and time. He is agreeable. Referral placed to [place] at [facility] to see first available provider.  Cc: Theresia Lo for referral coordination [place] to provider for final review. Patient agreeable to disposition. Will close encounter.

## 2015-09-26 ENCOUNTER — Telehealth: Payer: Self-pay

## 2015-09-26 NOTE — Telephone Encounter (Signed)
Spoke with patient's husband Marchia Bond, okay per ROI. Advised of results as seen below. He is agreeable and verbalizes understanding. He will inform the patient. Aware a referral has been placed for the patient to be seen with [place] at [facility] for establishment with a PCP for evaluation of evaluated liver function. Aware the patient will be contacted directly by our referrals coordinator or [place] at [facility] to schedule this appointment. He is agreeable and verbalizes understanding.  CC: Theresia Lo  Routing to provider for final review. Patient agreeable to disposition. Will close encounter.

## 2015-09-26 NOTE — Telephone Encounter (Signed)
-----   Message from Nunzio Cobbs, MD sent at 09/25/2015  1:10 PM EDT ----- Please contact patient regarding her negative UC.  I just sent her a My Chart message but I want to be sure she understands the result which shows no infection.  I would prefer to have her stop the Bactrim because I do not know what we are treating.  The Bactrim may also create some liver changes, and her enzymes are already elevated.  I am not certain of the etiology of the elevated liver function.   Cc - Marisa Sprinkles

## 2015-10-03 ENCOUNTER — Ambulatory Visit (INDEPENDENT_AMBULATORY_CARE_PROVIDER_SITE_OTHER): Payer: BLUE CROSS/BLUE SHIELD | Admitting: Family Medicine

## 2015-10-03 ENCOUNTER — Other Ambulatory Visit: Payer: Self-pay | Admitting: Family Medicine

## 2015-10-03 ENCOUNTER — Encounter: Payer: Self-pay | Admitting: Family Medicine

## 2015-10-03 VITALS — BP 110/77 | HR 71 | Temp 98.4°F | Resp 20 | Ht 64.0 in | Wt 175.8 lb

## 2015-10-03 DIAGNOSIS — R748 Abnormal levels of other serum enzymes: Secondary | ICD-10-CM | POA: Diagnosis not present

## 2015-10-03 DIAGNOSIS — R1013 Epigastric pain: Secondary | ICD-10-CM

## 2015-10-03 DIAGNOSIS — Z79890 Hormone replacement therapy (postmenopausal): Secondary | ICD-10-CM

## 2015-10-03 DIAGNOSIS — Z7689 Persons encountering health services in other specified circumstances: Secondary | ICD-10-CM

## 2015-10-03 DIAGNOSIS — G8929 Other chronic pain: Secondary | ICD-10-CM | POA: Diagnosis not present

## 2015-10-03 DIAGNOSIS — Z7189 Other specified counseling: Secondary | ICD-10-CM | POA: Diagnosis not present

## 2015-10-03 LAB — CBC WITH DIFFERENTIAL/PLATELET
BASOS ABS: 64 {cells}/uL (ref 0–200)
BASOS PCT: 1 %
EOS ABS: 256 {cells}/uL (ref 15–500)
EOS PCT: 4 %
HCT: 38.1 % (ref 35.0–45.0)
HEMOGLOBIN: 12.8 g/dL (ref 11.7–15.5)
LYMPHS ABS: 2432 {cells}/uL (ref 850–3900)
Lymphocytes Relative: 38 %
MCH: 28.7 pg (ref 27.0–33.0)
MCHC: 33.6 g/dL (ref 32.0–36.0)
MCV: 85.4 fL (ref 80.0–100.0)
MPV: 8.1 fL (ref 7.5–12.5)
Monocytes Absolute: 384 cells/uL (ref 200–950)
Monocytes Relative: 6 %
NEUTROS ABS: 3264 {cells}/uL (ref 1500–7800)
Neutrophils Relative %: 51 %
PLATELETS: 518 10*3/uL — AB (ref 140–400)
RBC: 4.46 MIL/uL (ref 3.80–5.10)
RDW: 14 % (ref 11.0–15.0)
WBC: 6.4 10*3/uL (ref 3.8–10.8)

## 2015-10-03 LAB — H. PYLORI ANTIBODY, IGG: H PYLORI IGG: NEGATIVE

## 2015-10-03 LAB — HEPATIC FUNCTION PANEL
ALBUMIN: 4.6 g/dL (ref 3.5–5.2)
ALK PHOS: 154 U/L — AB (ref 39–117)
ALT: 44 U/L — ABNORMAL HIGH (ref 0–35)
AST: 35 U/L (ref 0–37)
BILIRUBIN DIRECT: 0.2 mg/dL (ref 0.0–0.3)
Total Bilirubin: 1.2 mg/dL (ref 0.2–1.2)
Total Protein: 7.9 g/dL (ref 6.0–8.3)

## 2015-10-03 MED ORDER — FLUTICASONE PROPIONATE 50 MCG/ACT NA SUSP: 1.0000 | Freq: Every day | NASAL | Status: DC

## 2015-10-03 MED ORDER — OMEPRAZOLE 40 MG PO CPDR: 40.0000 mg | DELAYED_RELEASE_CAPSULE | Freq: Every day | ORAL | Status: DC

## 2015-10-03 NOTE — Progress Notes (Signed)
Patient ID: Brittany Kelly, female   DOB: 06/11/1970, 45 y.o.   MRN: [id number]      Patient ID: Brittany Kelly, female  DOB: 11/13/1970, 45 y.o.   MRN: [id number]  Subjective:  Brittany Kelly is a 45 y.o. female present for establishment of care with acute medical illness.  All past medical history, surgical history, allergies, family history, immunizations, medications and social history were obtained/entered in the electronic medical record today. All recent labs, ED visits and hospitalizations within the last year were reviewed. Interpretor is present for the visit today except the examination ([place]).   Patient presents for new patient establishment after experiencing a emergency room visit secondary to GI symptoms. Patient was then followed by her gynecologist, which has been  her primary care provider. Patient presented to the emergency room on May 6th after experiencing burning throat and stomach sensation, nausea, dark yellow urine, fatigue, headache, fever, chills, stomach bloating, stomach pain, abdominal bloating and diarrhea after her return from her [place] vacation. At that time she reported a headache as a generalized throbbing headache in which she was taking Tylenol and Advil. By ED note she also was complaining of dizziness. She had denied any vomiting, bloody stools, urinary frequency or dysuria. Patient has had a history of reflux in the past and had been on medications for reflux, but she had not been taking them prior to her vacation. She endorses consuming raw fruits, vegetables and a razor and off of the resort. She also endorses eating meats from food vendors off of the resort. Patient also admits to eating spicy or increase your foods and she is used to consuming. Patient states that the majority of all of her symptoms have resolved, but the sharp intermittent periumbilical/epigastric pain and fatigue has remained. Patient endorses increased Tylenol and Advil use for  headache associated with the above symptoms. She denies any heavy alcohol consumption during her vacation. She states her appetite was decreased therefore she did not eat or drink much. He does admit to drinking green tea and coffee mostly during her vacation. Bilateral work in the emergency room they felt the patient possibly had a urinary tract infection secondary to leukocytes in the urine, however urine culture resulted with no growth. Patient had reported significant improvement in her stomach pain after receiving a GI cocktail. She was discharged with prescription of ciprofloxacin to cover UTI and infectious diarrhea. Patient denies any other family members became ill. Patient has had a hysterectomy and cholecystectomy. She endorses having prior urinary problems were corrected with her hysterectomy. Lab results from emergency room visit and chronic logical office reflected original alkaline phosphatase of 147 and increased to 197, AST  55 that increased to 157, ALT 47 increased to 112. WBC 7.5 decreased to 10.3, neutrophil 9.0% normalized. Initially normal monocytes, that mildly increased on second collection.   Health maintenance:  Colonoscopy: No FHX reported, screen at 50.  Mammogram: No FHX reported, last study 11/01/2014; BiRads 1 Cervical cancer screening: Hysterectomy; Last PAP completed by GYN (Dr. Silva)  10/18/2013, normal with negative HPV co-test Immunizations: Unknown by pt, and not recorded in system records. Likely had tdap in 1996 with pregnancy.  Infectious disease screening: Unknown by pt, likley had HIV completed during pregnancy 1996.  Recent Results (from the past 2160 hour(s))  Urinalysis, Routine w reflex microscopic (not at [facility])     Status: Abnormal   Collection Time: 09/21/15  5:50 PM  Result Value Ref Range   Color, Urine YELLOW YELLOW     APPearance CLOUDY (A) CLEAR   Specific Gravity, Urine 1.013 1.005 - 1.030   pH 6.5 5.0 - 8.0   Glucose, UA NEGATIVE NEGATIVE mg/dL    Hgb urine dipstick MODERATE (A) NEGATIVE   Bilirubin Urine NEGATIVE NEGATIVE   Ketones, ur NEGATIVE NEGATIVE mg/dL   Protein, ur 30 (A) NEGATIVE mg/dL   Nitrite POSITIVE (A) NEGATIVE   Leukocytes, UA LARGE (A) NEGATIVE  Urine microscopic-add on     Status: Abnormal   Collection Time: 09/21/15  5:50 PM  Result Value Ref Range   Squamous Epithelial / LPF 0-5 (A) NONE SEEN   WBC, UA TOO NUMEROUS TO COUNT 0 - 5 WBC/hpf   RBC / HPF 6-30 0 - 5 RBC/hpf   Bacteria, UA MANY (A) NONE SEEN  CBC with Differential     Status: Abnormal   Collection Time: 09/21/15  6:05 PM  Result Value Ref Range   WBC 11.5 (H) 4.0 - 10.5 K/uL   RBC 4.62 3.87 - 5.11 MIL/uL   Hemoglobin 13.4 12.0 - 15.0 g/dL   HCT 38.1 36.0 - 46.0 %   MCV 82.5 78.0 - 100.0 fL   MCH 29.0 26.0 - 34.0 pg   MCHC 35.2 30.0 - 36.0 g/dL   RDW 13.1 11.5 - 15.5 %   Platelets 378 150 - 400 K/uL   Neutrophils Relative % 79 %   Neutro Abs 9.0 (H) 1.7 - 7.7 K/uL   Lymphocytes Relative 16 %   Lymphs Abs 1.8 0.7 - 4.0 K/uL   Monocytes Relative 4 %   Monocytes Absolute 0.5 0.1 - 1.0 K/uL   Eosinophils Relative 1 %   Eosinophils Absolute 0.2 0.0 - 0.7 K/uL   Basophils Relative 0 %   Basophils Absolute 0.0 0.0 - 0.1 K/uL  Comprehensive metabolic panel     Status: Abnormal   Collection Time: 09/21/15  6:05 PM  Result Value Ref Range   Sodium 136 135 - 145 mmol/L   Potassium 3.9 3.5 - 5.1 mmol/L   Chloride 103 101 - 111 mmol/L   CO2 28 22 - 32 mmol/L   Glucose, Bld 106 (H) 65 - 99 mg/dL   BUN 17 6 - 20 mg/dL   Creatinine, Ser 0.76 0.44 - 1.00 mg/dL   Calcium 8.8 (L) 8.9 - 10.3 mg/dL   Total Protein 7.9 6.5 - 8.1 g/dL   Albumin 3.9 3.5 - 5.0 g/dL   AST 55 (H) 15 - 41 U/L   ALT 47 14 - 54 U/L   Alkaline Phosphatase 147 (H) 38 - 126 U/L   Total Bilirubin 0.9 0.3 - 1.2 mg/dL   GFR calc non Af Amer >60 >60 mL/min   GFR calc Af Amer >60 >60 mL/min    Comment: (NOTE) The eGFR has been calculated using the CKD EPI equation. This  calculation has not been validated in all clinical situations. eGFR's persistently <60 mL/min signify possible Chronic Kidney Disease.    Anion gap 5 5 - 15  Lipase, blood     Status: None   Collection Time: 09/21/15  6:05 PM  Result Value Ref Range   Lipase 14 11 - 51 U/L  POCT urinalysis dipstick     Status: Abnormal   Collection Time: 09/23/15  1:45 PM  Result Value Ref Range   Color, UA yellow    Clarity, UA cloudy    Glucose, UA n    Bilirubin, UA n    Ketones, UA n    Spec Grav,   UA     Blood, UA 1+    pH, UA 5.0    Protein, UA n    Urobilinogen, UA negative    Nitrite, UA n    Leukocytes, UA Trace (A) Negative  CBC with Differential/Platelet     Status: Abnormal   Collection Time: 09/23/15  2:10 PM  Result Value Ref Range   WBC 10.3 3.8 - 10.8 K/uL   RBC 4.38 3.80 - 5.10 MIL/uL   Hemoglobin 12.5 11.7 - 15.5 g/dL   HCT 36.9 35.0 - 45.0 %   MCV 84.2 80.0 - 100.0 fL   MCH 28.5 27.0 - 33.0 pg   MCHC 33.9 32.0 - 36.0 g/dL   RDW 13.7 11.0 - 15.0 %   Platelets 350 140 - 400 K/uL   MPV 9.1 7.5 - 12.5 fL   Neutro Abs 6489 1500 - 7800 cells/uL   Lymphs Abs 2472 850 - 3900 cells/uL   Monocytes Absolute 1133 (H) 200 - 950 cells/uL   Eosinophils Absolute 206 15 - 500 cells/uL   Basophils Absolute 0 0 - 200 cells/uL   Neutrophils Relative % 63 %   Lymphocytes Relative 24 %   Monocytes Relative 11 %   Eosinophils Relative 2 %   Basophils Relative 0 %   Smear Review Criteria for review not met     Comment: ** Please note change in unit of measure and reference range(s). **  Comprehensive metabolic panel     Status: Abnormal   Collection Time: 09/23/15  2:10 PM  Result Value Ref Range   Sodium 136 135 - 146 mmol/L   Potassium 4.8 3.5 - 5.3 mmol/L   Chloride 99 98 - 110 mmol/L   CO2 28 20 - 31 mmol/L   Glucose, Bld 85 65 - 99 mg/dL   BUN 9 7 - 25 mg/dL   Creat 0.64 0.50 - 1.10 mg/dL   Total Bilirubin 0.7 0.2 - 1.2 mg/dL   Alkaline Phosphatase 197 (H) 33 - 115 U/L    AST 157 (H) 10 - 35 U/L   ALT 112 (H) 6 - 29 U/L   Total Protein 7.2 6.1 - 8.1 g/dL   Albumin 3.8 3.6 - 5.1 g/dL   Calcium 8.6 8.6 - 10.2 mg/dL  Urine culture     Status: None   Collection Time: 09/23/15  3:28 PM  Result Value Ref Range   Colony Count NO GROWTH    Organism ID, Bacteria NO GROWTH   Urine Microscopic     Status: Abnormal   Collection Time: 09/23/15  3:28 PM  Result Value Ref Range   WBC, UA 10-20 (A) <=5 WBC/HPF   RBC / HPF NONE SEEN <=2 RBC/HPF   Squamous Epithelial / LPF NONE SEEN <=5 HPF   Bacteria, UA NONE SEEN NONE SEEN HPF   Crystals NONE SEEN NONE SEEN HPF   Casts NONE SEEN NONE SEEN LPF   Yeast NONE SEEN NONE SEEN HPF     Past Medical History  Diagnosis Date  . PONV (postoperative nausea and vomiting)   . Medical history non-contributory   . Allergy   . Allergic rhinitis    No Known Allergies Past Surgical History  Procedure Laterality Date  . Cyst excision Bilateral 1995    under both arms  . Cesarean section  1996  . Cholecystectomy    . Cholecystectomy    . Robotic assisted total hysterectomy with bilateral salpingo oopherectomy Bilateral 11/21/2013    Procedure: ROBOTIC ASSISTED TOTAL   HYSTERECTOMY WITH BILATERAL SALPINGO OOPHORECTOMY;  Surgeon: Brook E Amundson de Carvalho E Silva, MD;  Location: [facility];  Service: Gynecology;  Laterality: Bilateral;  . Cystoscopy N/A 11/21/2013    Procedure: CYSTOSCOPY;  Surgeon: Brook E Amundson de Carvalho E Silva, MD;  Location: [facility];  Service: Gynecology;  Laterality: N/A;   Family History  Problem Relation Age of Onset  . Diabetes Mother   . Hypertension Mother   . Arthritis Mother   . Hypertension Father   . Arthritis Father    Social History   Social History  . Marital Status: Married    Spouse Name: N/A  . Number of Children: N/A  . Years of Education: N/A   Occupational History  . Not on file.   Social History Main Topics  . Smoking status: Never Smoker   . Smokeless tobacco: Never Used   . Alcohol Use: 2.4 oz/week    4 Standard drinks or equivalent per week  . Drug Use: No  . Sexual Activity:    Partners: Male    Birth Control/ Protection: Surgical     Comment: R-TAH/BSO   Other Topics Concern  . Not on file   Social History Narrative   Married to Marvin Espinoza. 1 child.    Some college. Accountant supervisor.   Drinks caffeine.    Wears her seatbelt.   Exercises > 3x week.    Guns in the home in a locked cabinet.    Feels safe in her relationships.      Medication List       This list is accurate as of: 10/03/15 12:16 PM.  Always use your most recent med list.               conjugated estrogens vaginal cream  Commonly known as:  PREMARIN  Use 1/2 gram vaginally twice per week at bedtime     CONTRAVE 8-90 MG Tb12  Generic drug:  Naltrexone-Bupropion HCl ER     estradiol 0.0375 MG/24HR  Commonly known as:  VIVELLE-DOT  PLACE 1 PATCH ONTO SKIN TWICE A WEEK TO LOWER ABDOMEN,CHANGE TWICE A WEEK     famotidine 20 MG tablet  Commonly known as:  PEPCID  Take 1 tablet (20 mg total) by mouth daily.     fluticasone 50 MCG/ACT nasal spray  Commonly known as:  FLONASE  Place 1 spray into both nostrils daily.     omeprazole 40 MG capsule  Commonly known as:  PRILOSEC  Take 1 capsule (40 mg total) by mouth daily.     ondansetron 4 MG tablet  Commonly known as:  ZOFRAN  Take 1 tablet (4 mg total) by mouth every 6 (six) hours.     PAZEO 0.7 % Soln  Generic drug:  Olopatadine HCl  Place 1 drop into both eyes daily.     XIIDRA 5 % Soln  Generic drug:  Lifitegrast  Place 1 drop into both eyes daily.        ROS: Negative, with the exception of above mentioned in HPI  Objective: BP 110/77 mmHg  Pulse 71  Temp(Src) 98.4 F (36.9 C) (Oral)  Resp 20  Ht 5' 4" (1.626 m)  Wt 175 lb 12.8 oz (79.742 kg)  BMI 30.16 kg/m2  SpO2 95%  LMP 10/13/2013 Gen: Afebrile. No acute distress. Nontoxic in appearance, well-developed, well-nourished, female,  pleasant. Speaks some English. HENT: AT. [place]. Bilateral TM visualized and normal in appearance, normal external auditory canal. MMM, no oral lesions Bilateral   nares without erythema/swelling. Throat without erythema, ulcerations or exudates. No Cough on exam, Mild hoarseness on exam. Eyes:Pupils Equal Round Reactive to light, Extraocular movements intact,  Conjunctiva without redness, discharge or icterus. Neck/lymp/endocrine: Supple, Nolymphadenopathy CV: RRR, no murmur, no edema Chest: CTAB, no wheeze, rhonchi or crackles.  Abd: old Kocher's incision present. Soft. flat. Non-distended, TTP epigastric and right peri-umbilical BS present. No Masses palpated. No hepatosplenomegaly. No rebound tenderness or guarding. Skin: No  rashes, purpura or petechiae. Warm and well-perfused. Skin intact. Neuro/Msk: Normal gait. PERLA. EOMi. Alert. Oriented x3.   Psych: Normal affect, dress and demeanor. Normal speech. Normal thought content and judgment.  Assessment/plan: Brittany Kelly is a 45 y.o. female present for establishment care with acute medical illness. Patient is present with interpreter Meredith Pel) today. Elevated liver enzymes/epigastric pain  - increase in LFT after acute GI illness after [place] vacation. Timeline and story consistent with acute Hep A or typhoid illness both endemic to that area of the world. She did consume food off of street vendors and off of the resort, including meats and  raw fruits. - Other symptoms of burning throat/sore throat are difficult to decipher with patient if she is meaning GERD or viral illness-like symptom. Pt has a history of GERD in the past and was consuming spicy [place] fried foods, which could have caused her throat/epigastric symptoms or at least worsening her GERD along with acute illness. Pt has been taking both tylenol and advil for her headache associated with this illness, which again could have worsened LFT (tylenol) or gastritis (ibuprofen).  - CBC  was originally with mild left shift, corrected after abx, and second collection with mild elevated monocytes.  - Hepatitis, Acute - Hepatic function panel - CBC w/Diff - Pathologist smear review - Epstein-Barr virus VCA, IgG - Epstein-Barr virus VCA, IgM - CMV IgM - H. pylori antibody, IgG - Will r/o acute hepatitis infection and other viral possibilities - If signs of infection by labs remain but viral panel negative, would consider cipro course for presumed typhoid infection, since lab studies for typhoid are not sensitive for where she is in her illness course. She did receive rocephin and partial course of bactrim (both which would have started treatment for typhoid and could be why she had improvement in her WBC count between collections).  - AVOID tylenol and ETOH completely until LFT return to normal.  - GERD diet, avoid NSAIDS, Prilosec higher dose for 4 weeks, +/- prevacid by other provider. Suspect gastritis worsening and she will likely benefit from short course.  - omeprazole (PRILOSEC) 40 MG capsule; Take 1 capsule (40 mg total) by mouth daily.  Dispense: 30 capsule; Refill: 1  On postmenopausal hormone replacement therapy - Continue to follow with Dr. Quincy Simmonds  Return in about 4 weeks (around 10/31/2015) for LFT. Unless current labs indicate need to be seen sooner.   Greater than 45 minutes was spent with patient, greater than 50% of that time was spent face-to-face with patient counseling and coordinating care.  Electronically signed by: Howard Pouch, DO [place]

## 2015-10-03 NOTE — Patient Instructions (Signed)
I believe you have or had, an infection which could have been in your food in [place]. I also think you have GERD or stomach inflammation that was made worse by this infection . I have called in a medicine today for your stomach take every day for 4-8 weeks.  Continue the medicine from Dr. Quincy Simmonds as well.  Follow GERD diet below. No aleve, advil , asprin and no tylenol.   Food Choices for Gastroesophageal Reflux Disease, Adult When you have gastroesophageal reflux disease (GERD), the foods you eat and your eating habits are very important. Choosing the right foods can help ease the discomfort of GERD. WHAT GENERAL GUIDELINES DO I NEED TO FOLLOW?  Choose fruits, vegetables, whole grains, low-fat dairy products, and low-fat meat, fish, and poultry.  Limit fats such as oils, salad dressings, butter, nuts, and avocado.  Keep a food diary to identify foods that cause symptoms.  Avoid foods that cause reflux. These may be different for different people.  Eat frequent small meals instead of three large meals each day.  Eat your meals slowly, in a relaxed setting.  Limit fried foods.  Cook foods using methods other than frying.  Avoid drinking alcohol.  Avoid drinking large amounts of liquids with your meals.  Avoid bending over or lying down until 2-3 hours after eating. WHAT FOODS ARE NOT RECOMMENDED? The following are some foods and drinks that may worsen your symptoms: Vegetables Tomatoes. Tomato juice. Tomato and spaghetti sauce. Chili peppers. Onion and garlic. Horseradish. Fruits Oranges, grapefruit, and lemon (fruit and juice). Meats High-fat meats, fish, and poultry. This includes hot dogs, ribs, ham, sausage, salami, and bacon. Dairy Whole milk and chocolate milk. Sour cream. Cream. Butter. Ice cream. Cream cheese.  Beverages Coffee and tea, with or without caffeine. Carbonated beverages or energy drinks. Condiments Hot sauce. Barbecue sauce.  Sweets/Desserts Chocolate  and cocoa. Donuts. Peppermint and spearmint. Fats and Oils High-fat foods, including [place] fries and potato chips. Other Vinegar. Strong spices, such as black pepper, white pepper, red pepper, cayenne, curry powder, cloves, ginger, and chili powder. The items listed above may not be a complete list of foods and beverages to avoid. Contact your dietitian for more information.   This information is not intended to replace advice given to you by your health care provider. Make sure you discuss any questions you have with your health care provider.   Document Released: 05/04/2005 Document Revised: 05/25/2014 Document Reviewed: 03/08/2013 Elsevier Interactive Patient Education 2016 [place].  Gastroesophageal Reflux Disease, Adult Normally, food travels down the esophagus and stays in the stomach to be digested. However, when a person has gastroesophageal reflux disease (GERD), food and stomach acid move back up into the esophagus. When this happens, the esophagus becomes sore and inflamed. Over time, GERD can create small holes (ulcers) in the lining of the esophagus.  CAUSES This condition is caused by a problem with the muscle between the esophagus and the stomach (lower esophageal sphincter, or LES). Normally, the LES muscle closes after food passes through the esophagus to the stomach. When the LES is weakened or abnormal, it does not close properly, and that allows food and stomach acid to go back up into the esophagus. The LES can be weakened by certain dietary substances, medicines, and medical conditions, including:  Tobacco use.  Pregnancy.  Having a hiatal hernia.  Heavy alcohol use.  Certain foods and beverages, such as coffee, chocolate, onions, and peppermint. RISK FACTORS This condition is more likely to  develop in:  People who have an increased body weight.  People who have connective tissue disorders.  People who use NSAID medicines. SYMPTOMS Symptoms of this  condition include:  Heartburn.  Difficult or painful swallowing.  The feeling of having a lump in the throat.  Abitter taste in the mouth.  Bad breath.  Having a large amount of saliva.  Having an upset or bloated stomach.  Belching.  Chest pain.  Shortness of breath or wheezing.  Ongoing (chronic) cough or a night-time cough.  Wearing away of tooth enamel.  Weight loss. Different conditions can cause chest pain. Make sure to see your health care provider if you experience chest pain. DIAGNOSIS Your health care provider will take a medical history and perform a physical exam. To determine if you have mild or severe GERD, your health care provider may also monitor how you respond to treatment. You may also have other tests, including:  An endoscopy toexamine your stomach and esophagus with a small camera.  A test thatmeasures the acidity level in your esophagus.  A test thatmeasures how much pressure is on your esophagus.  A barium swallow or modified barium swallow to show the shape, size, and functioning of your esophagus. TREATMENT The goal of treatment is to help relieve your symptoms and to prevent complications. Treatment for this condition may vary depending on how severe your symptoms are. Your health care provider may recommend:  Changes to your diet.  Medicine.  Surgery. HOME CARE INSTRUCTIONS Diet  Follow a diet as recommended by your health care provider. This may involve avoiding foods and drinks such as:  Coffee and tea (with or without caffeine).  Drinks that containalcohol.  Energy drinks and sports drinks.  Carbonated drinks or sodas.  Chocolate and cocoa.  Peppermint and mint flavorings.  Garlic and onions.  Horseradish.  Spicy and acidic foods, including peppers, chili powder, curry powder, vinegar, hot sauces, and barbecue sauce.  Citrus fruit juices and citrus fruits, such as oranges, lemons, and limes.  Tomato-based  foods, such as red sauce, chili, salsa, and pizza with red sauce.  Fried and fatty foods, such as donuts, french fries, potato chips, and high-fat dressings.  High-fat meats, such as hot dogs and fatty cuts of red and white meats, such as rib eye steak, sausage, ham, and bacon.  High-fat dairy items, such as whole milk, butter, and cream cheese.  Eat small, frequent meals instead of large meals.  Avoid drinking large amounts of liquid with your meals.  Avoid eating meals during the 2-3 hours before bedtime.  Avoid lying down right after you eat.  Do not exercise right after you eat. General Instructions  Pay attention to any changes in your symptoms.  Take over-the-counter and prescription medicines only as told by your health care provider. Do not take aspirin, ibuprofen, or other NSAIDs unless your health care provider told you to do so.  Do not use any tobacco products, including cigarettes, chewing tobacco, and e-cigarettes. If you need help quitting, ask your health care provider.  Wear loose-fitting clothing. Do not wear anything tight around your waist that causes pressure on your abdomen.  Raise (elevate) the head of your bed 6 inches (15cm).  Try to reduce your stress, such as with yoga or meditation. If you need help reducing stress, ask your health care provider.  If you are overweight, reduce your weight to an amount that is healthy for you. Ask your health care provider for guidance about a  safe weight loss goal.  Keep all follow-up visits as told by your health care provider. This is important. SEEK MEDICAL CARE IF:  You have new symptoms.  You have unexplained weight loss.  You have difficulty swallowing, or it hurts to swallow.  You have wheezing or a persistent cough.  Your symptoms do not improve with treatment.  You have a hoarse voice. SEEK IMMEDIATE MEDICAL CARE IF:  You have pain in your arms, neck, jaw, teeth, or back.  You feel sweaty,  dizzy, or light-headed.  You have chest pain or shortness of breath.  You vomit and your vomit looks like blood or coffee grounds.  You faint.  Your stool is bloody or black.  You cannot swallow, drink, or eat.   This information is not intended to replace advice given to you by your health care provider. Make sure you discuss any questions you have with your health care provider.   Document Released: 02/11/2005 Document Revised: 01/23/2015 Document Reviewed: 08/29/2014 Elsevier Interactive Patient Education [employer].

## 2015-10-04 ENCOUNTER — Telehealth: Payer: Self-pay | Admitting: Family Medicine

## 2015-10-04 LAB — HEPATITIS PANEL, ACUTE
HCV AB: NEGATIVE
HEP A IGM: NONREACTIVE
HEP B C IGM: NONREACTIVE
Hepatitis B Surface Ag: NEGATIVE

## 2015-10-04 LAB — EPSTEIN-BARR VIRUS VCA, IGG: EBV VCA IGG: 417 U/mL — AB (ref <18.0)

## 2015-10-04 LAB — PATHOLOGIST SMEAR REVIEW

## 2015-10-04 LAB — CMV IGM

## 2015-10-04 LAB — EPSTEIN-BARR VIRUS VCA, IGM: EBV VCA IGM: 16.9 U/mL (ref <36.0)

## 2015-10-04 NOTE — Telephone Encounter (Signed)
Please call pt or husband: The viral panel we were waiting has returned and is also negative.  - f/u as discussed  On Friday call in 4 weeks

## 2015-10-04 NOTE — Telephone Encounter (Signed)
Spoke with patient husband reviewed results . Patient husband verbalized understanding.

## 2015-10-04 NOTE — Telephone Encounter (Signed)
Please call pt (or her husband is ok as well- she states his English is better): - Her hepatitis panel is negative. Her liver enzymes have almost returned to normal, they are muched improved.  - Her labs also no longer show signs of infection, so I do not feel an abx is needed at this time.  - We will call again once the other labs return, they do take more time and will likely return next week.  - I will need to see her regardless in 4 weeks for repeat labs to ensure LFT normalize completely and follow up on her GERD like symptoms after starting new medication.

## 2015-10-07 NOTE — Telephone Encounter (Signed)
Left message with patient results on voice mail.

## 2015-10-09 DIAGNOSIS — H04123 Dry eye syndrome of bilateral lacrimal glands: Secondary | ICD-10-CM | POA: Diagnosis not present

## 2015-10-15 ENCOUNTER — Other Ambulatory Visit: Payer: Self-pay | Admitting: Obstetrics and Gynecology

## 2015-10-15 NOTE — Telephone Encounter (Signed)
Medication refill request: Vivelle Patch  Last AEX:  10-24-14  Next AEX: 11-06-15  Last MMG (if hormonal medication request): 11-01-14 WNL  Refill authorized: please advise

## 2015-10-30 DIAGNOSIS — E663 Overweight: Secondary | ICD-10-CM | POA: Diagnosis not present

## 2015-10-30 DIAGNOSIS — Z713 Dietary counseling and surveillance: Secondary | ICD-10-CM | POA: Diagnosis not present

## 2015-11-06 ENCOUNTER — Encounter: Payer: Self-pay | Admitting: Obstetrics and Gynecology

## 2015-11-06 ENCOUNTER — Ambulatory Visit (INDEPENDENT_AMBULATORY_CARE_PROVIDER_SITE_OTHER): Payer: BLUE CROSS/BLUE SHIELD | Admitting: Obstetrics and Gynecology

## 2015-11-06 VITALS — BP 120/70 | HR 80 | Resp 16 | Ht 64.0 in | Wt 174.0 lb

## 2015-11-06 DIAGNOSIS — Z01419 Encounter for gynecological examination (general) (routine) without abnormal findings: Secondary | ICD-10-CM | POA: Diagnosis not present

## 2015-11-06 MED ORDER — ESTROGENS, CONJUGATED 0.625 MG/GM VA CREA: TOPICAL_CREAM | VAGINAL | Status: DC

## 2015-11-06 MED ORDER — ESTRADIOL 0.0375 MG/24HR TD PTTW: MEDICATED_PATCH | TRANSDERMAL | Status: DC

## 2015-11-06 NOTE — Progress Notes (Signed)
Patient ID: Brittany Kelly, female   DOB: August 26, 1970, 45 y.o.   MRN: [id number] 45 y.o. [id number] [name] here for annual exam.    Taking ERT for menopausal symptoms.  Happy with her ERT.  Also using vaginal estrogen cream.   Seeing PCP for elevated LFTs.   Still has a little bit of reflux.   Lost about 30 pounds.  PCP:   Howard Pouch, DO  Patient's last menstrual period was 10/13/2013.           Sexually active: Yes.    The current method of family planning is status post hysterectomy.    Exercising: Yes.    tennis Smoker:  no  Health Maintenance: Pap:  10-18-13 Neg:Neg HR HPV History of abnormal Pap:  no MMG:  11-01-14 Density C/Neg/BiRads1:[facility] Colonoscopy:  n/a BMD:   n/a  Result  n/a TDaP:  Up to date with PCP Gardasil:   N/A Screening Labs:  Hb today: done 09/23/15, Urine today: done 09/23/15   reports that she has never smoked. She has never used smokeless tobacco. She reports that she drinks about 2.4 oz of alcohol per week. She reports that she does not use illicit drugs.  Past Medical History  Diagnosis Date  . PONV (postoperative nausea and vomiting)   . Medical history non-contributory   . Allergy   . Allergic rhinitis     Past Surgical History  Procedure Laterality Date  . Cyst excision Bilateral 1995    under both arms  . Cesarean section  1996  . Cholecystectomy    . Cholecystectomy    . Robotic assisted total hysterectomy with bilateral salpingo oopherectomy Bilateral 11/21/2013    Procedure: ROBOTIC ASSISTED TOTAL HYSTERECTOMY WITH BILATERAL SALPINGO OOPHORECTOMY;  Surgeon: Jamey Reas de Berton Lan, MD;  Location: [facility];  Service: Gynecology;  Laterality: Bilateral;  . Cystoscopy N/A 11/21/2013    Procedure: CYSTOSCOPY;  Surgeon: Jamey Reas de Berton Lan, MD;  Location: [facility];  Service: Gynecology;  Laterality: N/A;    Current Outpatient Prescriptions  Medication Sig Dispense Refill  . conjugated  estrogens (PREMARIN) vaginal cream Use 1/2 gram vaginally twice per week at bedtime 42.5 g 2  . CONTRAVE 8-90 MG TB12   1  . estradiol (VIVELLE-DOT) 0.0375 MG/24HR PLACE 1 PATCH ONTO SKIN TWICE A WEEK TO LOWER ABDOMEN,CHANGE TWICE A WEEK 8 patch 0  . famotidine (PEPCID) 20 MG tablet Take 1 tablet (20 mg total) by mouth daily. 30 tablet 1  . fluticasone (FLONASE) 50 MCG/ACT nasal spray Place 1 spray into both nostrils daily. 16 g 5  . ibuprofen (ADVIL,MOTRIN) 800 MG tablet Take 800 mg by mouth as needed.  0  . omeprazole (PRILOSEC) 40 MG capsule Take 1 capsule (40 mg total) by mouth daily. 30 capsule 1  . ondansetron (ZOFRAN) 4 MG tablet Take 1 tablet (4 mg total) by mouth every 6 (six) hours. 12 tablet 0  . PAZEO 0.7 % SOLN Place 1 drop into both eyes daily.  2  . XIIDRA 5 % SOLN Place 1 drop into both eyes daily.  6   No current facility-administered medications for this visit.    Family History  Problem Relation Age of Onset  . Diabetes Mother   . Hypertension Mother   . Arthritis Mother   . Hypertension Father   . Arthritis Father     ROS:  Pertinent items are noted in HPI.  Otherwise, a comprehensive ROS was negative.  Exam:   BP 120/70 mmHg  Pulse 80  Resp 16  Ht 5\' 4"  (1.626 m)  Wt 174 lb (78.926 kg)  BMI 29.85 kg/m2  LMP 10/13/2013    General appearance: alert, cooperative and appears stated age Head: Normocephalic, without obvious abnormality, atraumatic Neck: no adenopathy, supple, symmetrical, trachea midline and thyroid normal to inspection and palpation Lungs: clear to auscultation bilaterally Breasts: normal appearance, no masses or tenderness, Inspection negative, No nipple retraction or dimpling, No nipple discharge or bleeding, No axillary or supraclavicular adenopathy Heart: regular rate and rhythm Abdomen: incisions:  Yes.    laparoscopic , soft, non-tender; no masses, no organomegaly Extremities: extremities normal, atraumatic, no cyanosis or edema Skin:  Skin color, texture, turgor normal. No rashes or lesions Lymph nodes: Cervical, supraclavicular, and axillary nodes normal. No abnormal inguinal nodes palpated Neurologic: Grossly normal  Pelvic: External genitalia:  no lesions              Urethra:  normal appearing urethra with no masses, tenderness or lesions              Bartholins and Skenes: normal                 Vagina: normal appearing vagina with normal color and discharge, no lesions              Cervix: absent              Pap taken: No. Bimanual Exam:  Uterus:  uterus absent              Adnexa: normal adnexa and no mass, fullness, tenderness              Rectal exam: Yes.  .  Confirms.              Anus:  normal sphincter tone, no lesions  Chaperone was present for exam.  Assessment:   Well woman visit with normal exam. Status post TLH/BSO.  On ERT. Doing intentional weight loss.  Plan: Yearly mammogram recommended after age 45.  She will call to schedule.  Recommended self breast exam.  Pap and HR HPV as above. Discussed Calcium, Vitamin D, regular exercise program including cardiovascular and weight bearing exercise. Labs performed.  No..    Labs with PCP.  Prescription medication(s) given.  Yes.  .  See orders.  Vivelle Dot and Premarin vaginal cream for one year.   risks of breast cancer and cardiovascular events have been reviewed. Follow up annually and prn.    After visit summary provided.

## 2015-11-06 NOTE — Patient Instructions (Signed)
Health Maintenance, Female Adopting a healthy lifestyle and getting preventive care can go a long way to promote health and wellness. Talk with your health care provider about what schedule of regular examinations is right for you. This is a good chance for you to check in with your provider about disease prevention and staying healthy. In between checkups, there are plenty of things you can do on your own. Experts have done a lot of research about which lifestyle changes and preventive measures are most likely to keep you healthy. Ask your health care provider for more information. WEIGHT AND DIET  Eat a healthy diet  Be sure to include plenty of vegetables, fruits, low-fat dairy products, and lean protein.  Do not eat a lot of foods high in solid fats, added sugars, or salt.  Get regular exercise. This is one of the most important things you can do for your health.  Most adults should exercise for at least 150 minutes each week. The exercise should increase your heart rate and make you sweat (moderate-intensity exercise).  Most adults should also do strengthening exercises at least twice a week. This is in addition to the moderate-intensity exercise.  Maintain a healthy weight  Body mass index (BMI) is a measurement that can be used to identify possible weight problems. It estimates body fat based on height and weight. Your health care provider can help determine your BMI and help you achieve or maintain a healthy weight.  For females 20 years of age and older:   A BMI below 18.5 is considered underweight.  A BMI of 18.5 to 24.9 is normal.  A BMI of 25 to 29.9 is considered overweight.  A BMI of 30 and above is considered obese.  Watch levels of cholesterol and blood lipids  You should start having your blood tested for lipids and cholesterol at 46 years of age, then have this test every 5 years.  You may need to have your cholesterol levels checked more often if:  Your lipid  or cholesterol levels are high.  You are older than 46 years of age.  You are at high risk for heart disease.  CANCER SCREENING   Lung Cancer  Lung cancer screening is recommended for adults 55-80 years old who are at high risk for lung cancer because of a history of smoking.  A yearly low-dose CT scan of the lungs is recommended for people who:  Currently smoke.  Have quit within the past 15 years.  Have at least a 30-pack-year history of smoking. A pack year is smoking an average of one pack of cigarettes a day for 1 year.  Yearly screening should continue until it has been 15 years since you quit.  Yearly screening should stop if you develop a health problem that would prevent you from having lung cancer treatment.  Breast Cancer  Practice breast self-awareness. This means understanding how your breasts normally appear and feel.  It also means doing regular breast self-exams. Let your health care provider know about any changes, no matter how small.  If you are in your 20s or 30s, you should have a clinical breast exam (CBE) by a health care provider every 1-3 years as part of a regular health exam.  If you are 40 or older, have a CBE every year. Also consider having a breast X-ray (mammogram) every year.  If you have a family history of breast cancer, talk to your health care provider about genetic screening.  If you   are at high risk for breast cancer, talk to your health care provider about having an MRI and a mammogram every year.  Breast cancer gene (BRCA) assessment is recommended for women who have family members with BRCA-related cancers. BRCA-related cancers include:  Breast.  Ovarian.  Tubal.  Peritoneal cancers.  Results of the assessment will determine the need for genetic counseling and BRCA1 and BRCA2 testing. Cervical Cancer Your health care provider may recommend that you be screened regularly for cancer of the pelvic organs (ovaries, uterus, and  vagina). This screening involves a pelvic examination, including checking for microscopic changes to the surface of your cervix (Pap test). You may be encouraged to have this screening done every 3 years, beginning at age 21.  For women ages 30-65, health care providers may recommend pelvic exams and Pap testing every 3 years, or they may recommend the Pap and pelvic exam, combined with testing for human papilloma virus (HPV), every 5 years. Some types of HPV increase your risk of cervical cancer. Testing for HPV may also be done on women of any age with unclear Pap test results.  Other health care providers may not recommend any screening for nonpregnant women who are considered low risk for pelvic cancer and who do not have symptoms. Ask your health care provider if a screening pelvic exam is right for you.  If you have had past treatment for cervical cancer or a condition that could lead to cancer, you need Pap tests and screening for cancer for at least 20 years after your treatment. If Pap tests have been discontinued, your risk factors (such as having a new sexual partner) need to be reassessed to determine if screening should resume. Some women have medical problems that increase the chance of getting cervical cancer. In these cases, your health care provider may recommend more frequent screening and Pap tests. Colorectal Cancer  This type of cancer can be detected and often prevented.  Routine colorectal cancer screening usually begins at 46 years of age and continues through 46 years of age.  Your health care provider may recommend screening at an earlier age if you have risk factors for colon cancer.  Your health care provider may also recommend using home test kits to check for hidden blood in the stool.  A small camera at the end of a tube can be used to examine your colon directly (sigmoidoscopy or colonoscopy). This is done to check for the earliest forms of colorectal  cancer.  Routine screening usually begins at age 50.  Direct examination of the colon should be repeated every 5-10 years through 46 years of age. However, you may need to be screened more often if early forms of precancerous polyps or small growths are found. Skin Cancer  Check your skin from head to toe regularly.  Tell your health care provider about any new moles or changes in moles, especially if there is a change in a mole's shape or color.  Also tell your health care provider if you have a mole that is larger than the size of a pencil eraser.  Always use sunscreen. Apply sunscreen liberally and repeatedly throughout the day.  Protect yourself by wearing long sleeves, pants, a wide-brimmed hat, and sunglasses whenever you are outside. HEART DISEASE, DIABETES, AND HIGH BLOOD PRESSURE   High blood pressure causes heart disease and increases the risk of stroke. High blood pressure is more likely to develop in:  People who have blood pressure in the high end   of the normal range (130-139/85-89 mm Hg).  People who are overweight or obese.  People who are African American.  If you are 38-23 years of age, have your blood pressure checked every 3-5 years. If you are 61 years of age or older, have your blood pressure checked every year. You should have your blood pressure measured twice--once when you are at a hospital or clinic, and once when you are not at a hospital or clinic. Record the average of the two measurements. To check your blood pressure when you are not at a hospital or clinic, you can use:  An automated blood pressure machine at a pharmacy.  A home blood pressure monitor.  If you are between 45 years and 39 years old, ask your health care provider if you should take aspirin to prevent strokes.  Have regular diabetes screenings. This involves taking a blood sample to check your fasting blood sugar level.  If you are at a normal weight and have a low risk for diabetes,  have this test once every three years after 46 years of age.  If you are overweight and have a high risk for diabetes, consider being tested at a younger age or more often. PREVENTING INFECTION  Hepatitis B  If you have a higher risk for hepatitis B, you should be screened for this virus. You are considered at high risk for hepatitis B if:  You were born in a country where hepatitis B is common. Ask your health care provider which countries are considered high risk.  Your parents were born in a high-risk country, and you have not been immunized against hepatitis B (hepatitis B vaccine).  You have HIV or AIDS.  You use needles to inject street drugs.  You live with someone who has hepatitis B.  You have had sex with someone who has hepatitis B.  You get hemodialysis treatment.  You take certain medicines for conditions, including cancer, organ transplantation, and autoimmune conditions. Hepatitis C  Blood testing is recommended for:  Everyone born from [birth year] through 1966.  Anyone with known risk factors for hepatitis C. Sexually transmitted infections (STIs)  You should be screened for sexually transmitted infections (STIs) including gonorrhea and chlamydia if:  You are sexually active and are younger than 46 years of age.  You are older than 46 years of age and your health care provider tells you that you are at risk for this type of infection.  Your sexual activity has changed since you were last screened and you are at an increased risk for chlamydia or gonorrhea. Ask your health care provider if you are at risk.  If you do not have HIV, but are at risk, it may be recommended that you take a prescription medicine daily to prevent HIV infection. This is called pre-exposure prophylaxis (PrEP). You are considered at risk if:  You are sexually active and do not regularly use condoms or know the HIV status of your partner(s).  You take drugs by injection.  You are sexually  active with a partner who has HIV. Talk with your health care provider about whether you are at high risk of being infected with HIV. If you choose to begin PrEP, you should first be tested for HIV. You should then be tested every 3 months for as long as you are taking PrEP.  PREGNANCY   If you are premenopausal and you may become pregnant, ask your health care provider about preconception counseling.  If you may  become pregnant, take 400 to 800 micrograms (mcg) of folic acid every day.  If you want to prevent pregnancy, talk to your health care provider about birth control (contraception). OSTEOPOROSIS AND MENOPAUSE   Osteoporosis is a disease in which the bones lose minerals and strength with aging. This can result in serious bone fractures. Your risk for osteoporosis can be identified using a bone density scan.  If you are 61 years of age or older, or if you are at risk for osteoporosis and fractures, ask your health care provider if you should be screened.  Ask your health care provider whether you should take a calcium or vitamin D supplement to lower your risk for osteoporosis.  Menopause may have certain physical symptoms and risks.  Hormone replacement therapy may reduce some of these symptoms and risks. Talk to your health care provider about whether hormone replacement therapy is right for you.  HOME CARE INSTRUCTIONS   Schedule regular health, dental, and eye exams.  Stay current with your immunizations.   Do not use any tobacco products including cigarettes, chewing tobacco, or electronic cigarettes.  If you are pregnant, do not drink alcohol.  If you are breastfeeding, limit how much and how often you drink alcohol.  Limit alcohol intake to no more than 1 drink per day for nonpregnant women. One drink equals 12 ounces of beer, 5 ounces of wine, or 1 ounces of hard liquor.  Do not use street drugs.  Do not share needles.  Ask your health care provider for help if  you need support or information about quitting drugs.  Tell your health care provider if you often feel depressed.  Tell your health care provider if you have ever been abused or do not feel safe at home.   This information is not intended to replace advice given to you by your health care provider. Make sure you discuss any questions you have with your health care provider.   Document Released: 11/17/2010 Document Revised: 05/25/2014 Document Reviewed: 04/05/2013 Elsevier Interactive Patient Education [employer].

## 2015-11-09 ENCOUNTER — Other Ambulatory Visit: Payer: Self-pay | Admitting: Obstetrics and Gynecology

## 2015-11-11 NOTE — Telephone Encounter (Signed)
Medication refill request: Estradiol 0.0375mg /24HR Last AEX:  11/06/15 BS Next AEX: 11/11/16 Last MMG (if hormonal medication request): 11/01/14 BIRADS1 negative Refill authorized: 11/06/15 #24patches; prescription already done at appointment. Receipt of confirmation from pharmacy

## 2015-11-20 ENCOUNTER — Other Ambulatory Visit: Payer: Self-pay | Admitting: Obstetrics and Gynecology

## 2015-11-20 DIAGNOSIS — J342 Deviated nasal septum: Secondary | ICD-10-CM | POA: Diagnosis not present

## 2015-11-20 DIAGNOSIS — J343 Hypertrophy of nasal turbinates: Secondary | ICD-10-CM | POA: Diagnosis not present

## 2015-11-20 DIAGNOSIS — J310 Chronic rhinitis: Secondary | ICD-10-CM | POA: Diagnosis not present

## 2015-11-20 DIAGNOSIS — Z1231 Encounter for screening mammogram for malignant neoplasm of breast: Secondary | ICD-10-CM

## 2015-11-21 ENCOUNTER — Other Ambulatory Visit (INDEPENDENT_AMBULATORY_CARE_PROVIDER_SITE_OTHER): Payer: Self-pay | Admitting: Otolaryngology

## 2015-11-21 DIAGNOSIS — J329 Chronic sinusitis, unspecified: Secondary | ICD-10-CM

## 2015-11-25 ENCOUNTER — Other Ambulatory Visit: Payer: Self-pay | Admitting: *Deleted

## 2015-11-25 DIAGNOSIS — R1013 Epigastric pain: Principal | ICD-10-CM

## 2015-11-25 DIAGNOSIS — G8929 Other chronic pain: Secondary | ICD-10-CM

## 2015-11-25 MED ORDER — OMEPRAZOLE 40 MG PO CPDR: 40.0000 mg | DELAYED_RELEASE_CAPSULE | Freq: Every day | ORAL | Status: DC

## 2015-11-25 NOTE — Telephone Encounter (Signed)
Omeprazole refill sent.

## 2015-11-26 ENCOUNTER — Other Ambulatory Visit: Payer: Self-pay | Admitting: Obstetrics and Gynecology

## 2015-11-26 NOTE — Telephone Encounter (Signed)
Medication refill request: Famotidine 20mg  Last AEX:  11/06/15 Dr. Quincy Simmonds Next AEX: 11/11/16 Last MMG (if hormonal medication request): 11/01/14 BIRADS1 negative Refill authorized: 09/23/15 #30 w/1 refill; today #30 w/1 refill?

## 2015-11-27 DIAGNOSIS — H16223 Keratoconjunctivitis sicca, not specified as Sjogren's, bilateral: Secondary | ICD-10-CM | POA: Diagnosis not present

## 2015-11-28 ENCOUNTER — Ambulatory Visit
Admission: RE | Admit: 2015-11-28 | Discharge: 2015-11-28 | Disposition: A | Discharge status: Home / Self Care | Payer: BLUE CROSS/BLUE SHIELD | Source: Ambulatory Visit | Attending: Obstetrics and Gynecology | Admitting: Obstetrics and Gynecology

## 2015-11-28 DIAGNOSIS — Z1231 Encounter for screening mammogram for malignant neoplasm of breast: Secondary | ICD-10-CM | POA: Diagnosis not present

## 2015-12-12 ENCOUNTER — Other Ambulatory Visit: Payer: BLUE CROSS/BLUE SHIELD

## 2016-01-29 DIAGNOSIS — Z713 Dietary counseling and surveillance: Secondary | ICD-10-CM | POA: Diagnosis not present

## 2016-01-29 DIAGNOSIS — E663 Overweight: Secondary | ICD-10-CM | POA: Diagnosis not present

## 2016-03-11 DIAGNOSIS — H16223 Keratoconjunctivitis sicca, not specified as Sjogren's, bilateral: Secondary | ICD-10-CM | POA: Diagnosis not present

## 2016-08-09 ENCOUNTER — Other Ambulatory Visit: Payer: Self-pay | Admitting: Obstetrics and Gynecology

## 2016-08-10 NOTE — Telephone Encounter (Signed)
Medication refill request: Premarin  Last AEX:  [id number]  Next AEX: 11-11-16  Last MMG (if hormonal medication request): 11-28-15 WNL  Refill authorized: please advise

## 2016-09-04 ENCOUNTER — Other Ambulatory Visit: Payer: Self-pay | Admitting: Obstetrics and Gynecology

## 2016-10-17 ENCOUNTER — Other Ambulatory Visit: Payer: Self-pay | Admitting: Obstetrics and Gynecology

## 2016-10-19 NOTE — Telephone Encounter (Signed)
Medication refill request: Estradiol (Vivelle-Dot) Last AEX:  11/06/15 BS Next AEX: 11/11/16 BS Last MMG (if hormonal medication request): 11/28/15 [facility] Refill authorized: 11/06/15 #24 Patch 3R. Please advise. Thank you.

## 2016-10-26 ENCOUNTER — Other Ambulatory Visit: Payer: Self-pay | Admitting: Obstetrics and Gynecology

## 2016-10-26 DIAGNOSIS — Z1231 Encounter for screening mammogram for malignant neoplasm of breast: Secondary | ICD-10-CM

## 2016-11-11 ENCOUNTER — Ambulatory Visit (INDEPENDENT_AMBULATORY_CARE_PROVIDER_SITE_OTHER): Payer: BLUE CROSS/BLUE SHIELD | Admitting: Obstetrics and Gynecology

## 2016-11-11 ENCOUNTER — Encounter: Payer: Self-pay | Admitting: Obstetrics and Gynecology

## 2016-11-11 VITALS — BP 118/76 | HR 80 | Resp 16 | Ht 63.0 in | Wt 173.0 lb

## 2016-11-11 DIAGNOSIS — R7989 Other specified abnormal findings of blood chemistry: Secondary | ICD-10-CM | POA: Diagnosis not present

## 2016-11-11 DIAGNOSIS — Z79899 Other long term (current) drug therapy: Secondary | ICD-10-CM | POA: Diagnosis not present

## 2016-11-11 DIAGNOSIS — R945 Abnormal results of liver function studies: Secondary | ICD-10-CM

## 2016-11-11 DIAGNOSIS — Z79818 Long term (current) use of other agents affecting estrogen receptors and estrogen levels: Secondary | ICD-10-CM

## 2016-11-11 DIAGNOSIS — Z01419 Encounter for gynecological examination (general) (routine) without abnormal findings: Secondary | ICD-10-CM

## 2016-11-11 DIAGNOSIS — Z780 Asymptomatic menopausal state: Secondary | ICD-10-CM | POA: Diagnosis not present

## 2016-11-11 MED ORDER — ESTRADIOL 0.0375 MG/24HR TD PTTW: 1.0000 | MEDICATED_PATCH | TRANSDERMAL | 3 refills | Status: DC

## 2016-11-11 MED ORDER — ESTROGENS, CONJUGATED 0.625 MG/GM VA CREA: TOPICAL_CREAM | VAGINAL | 2 refills | Status: DC

## 2016-11-11 NOTE — Patient Instructions (Signed)

## 2016-11-11 NOTE — Progress Notes (Signed)
46 y.o. G1P1001 [name] here for annual exam.    Lost 35 pounds.  Has hx elevated LFTs after traveling outside the country.  This has improved last year.   Is on estrogen therapy.  Still with some night sweats.  Wants to continue with ERT and the vaginal estrogen cream.  Using the cream only once per month.   Both parents have osteoporosis.  Having joint pains.   PCP:  Brittany Pouch, DO  Patient's last menstrual period was 10/14/2013.           Sexually active: Yes.    The current method of family planning is status post hysterectomy.    Exercising: Yes.    tennis Smoker:  no  Health Maintenance: Pap:  10-18-13 Neg:Neg HR HPV History of abnormal Pap:  no MMG:  11/28/15 BIRADS 1 negative/density c -- scheduled 11/30/16 TBC Colonoscopy:  n/a BMD:   n/a  Result  n/a TDaP:  Unsure -- possibly up to date Hep C: 10/03/15 Negative Screening Labs: PCP takes care of labs    reports that she has never smoked. She has never used smokeless tobacco. She reports that she drinks about 2.4 oz of alcohol per week . She reports that she does not use drugs.  Past Medical History:  Diagnosis Date  . Allergic rhinitis   . Allergy   . Medical history non-contributory   . PONV (postoperative nausea and vomiting)     Past Surgical History:  Procedure Laterality Date  . CESAREAN SECTION  1996  . cholecystectomy    . CHOLECYSTECTOMY    . CYST EXCISION Bilateral 1995   under both arms  . CYSTOSCOPY N/A 11/21/2013   Procedure: CYSTOSCOPY;  Surgeon: Brittany Reas de Berton Lan, MD;  Location: [facility];  Service: Gynecology;  Laterality: N/A;  . ROBOTIC ASSISTED TOTAL HYSTERECTOMY WITH BILATERAL SALPINGO OOPHERECTOMY Bilateral 11/21/2013   Procedure: ROBOTIC ASSISTED TOTAL HYSTERECTOMY WITH BILATERAL SALPINGO OOPHORECTOMY;  Surgeon: Brittany Reas de Berton Lan, MD;  Location: [facility];  Service: Gynecology;  Laterality: Bilateral;    Current Outpatient Prescriptions   Medication Sig Dispense Refill  . estradiol (VIVELLE-DOT) 0.0375 MG/24HR PLACE 1 PATCH ONTO SKIN TWICE A WEEK TO LOWER ABDOMEN,CHANGE TWICE A WEEK 8 patch 0  . famotidine (PEPCID) 20 MG tablet Take 1 tablet (20 mg total) by mouth daily. 30 tablet 1  . fluticasone (FLONASE) 50 MCG/ACT nasal spray Place 1 spray into both nostrils daily. 16 g 5  . ibuprofen (ADVIL,MOTRIN) 800 MG tablet Take 800 mg by mouth as needed.  0  . omeprazole (PRILOSEC) 40 MG capsule Take 1 capsule (40 mg total) by mouth daily. 30 capsule 2  . PREMARIN vaginal cream USE 1/2 GRAM VAGINALLY TWICE PER WEEK AT BEDTIME 30 g 0  . XIIDRA 5 % SOLN Place 1 drop into both eyes daily.  6   No current facility-administered medications for this visit.     Family History  Problem Relation Age of Onset  . Diabetes Mother   . Hypertension Mother   . Arthritis Mother   . Hypertension Father   . Arthritis Father     ROS:  Pertinent items are noted in HPI.  Otherwise, a comprehensive ROS was negative.  Exam:   BP 118/76 (BP Location: Right Arm, Patient Position: Sitting, Cuff Size: Normal)   Pulse 80   Resp 16   Ht 5\' 3"  (1.6 m)   Wt 173 lb (78.5 kg)   LMP  10/14/2013   BMI 30.65 kg/m     General appearance: alert, cooperative and appears stated age Head: Normocephalic, without obvious abnormality, atraumatic Neck: no adenopathy, supple, symmetrical, trachea midline and thyroid normal to inspection and palpation Lungs: clear to auscultation bilaterally Breasts: normal appearance, no masses or tenderness, No nipple retraction or dimpling, No nipple discharge or bleeding, No axillary or supraclavicular adenopathy Heart: regular rate and rhythm Abdomen: soft, non-tender; no masses, no organomegaly Extremities: extremities normal, atraumatic, no cyanosis or edema Skin: Skin color, texture, turgor normal. No rashes or lesions Lymph nodes: Cervical, supraclavicular, and axillary nodes normal. No abnormal inguinal nodes  palpated Neurologic: Grossly normal  Pelvic: External genitalia:  no lesions              Urethra:  normal appearing urethra with no masses, tenderness or lesions              Bartholins and Skenes: normal                 Vagina: normal appearing vagina with normal color and discharge, no lesions              Cervix: absent.               Pap taken: No. Bimanual Exam:  Uterus:  Absent.               Adnexa: no mass, fullness, tenderness              Rectal exam: Yes.  .  Confirms.              Anus:  normal sphincter tone, no lesions  Chaperone was present for exam.  Assessment:   Well woman visit with normal exam. Surgical menopause.   Estrogen therapy.  FH osteoporosis.  Joint pains.  Hx elevated liver function.   Plan: Mammogram screening discussed. Recommended self breast awareness. Pap and HR HPV as above. Guidelines for Calcium, Vitamin D, regular exercise program including cardiovascular and weight bearing exercise. Refill of Minivelle and vaginal estrogen therapy for one year. I discussed risks of stroke, DVT, PE, and possible breast cancer.  I did order a bone density due to patient's surgical menopause.   Will check LFTs today.  She will do her other labs with her PCP.  Follow up annually and prn.   After visit summary provided.

## 2016-11-12 LAB — COMPREHENSIVE METABOLIC PANEL
ALBUMIN: 4.6 g/dL (ref 3.5–5.5)
ALK PHOS: 99 IU/L (ref 39–117)
ALT: 14 IU/L (ref 0–32)
AST: 22 IU/L (ref 0–40)
Albumin/Globulin Ratio: 1.5 (ref 1.2–2.2)
BILIRUBIN TOTAL: 0.9 mg/dL (ref 0.0–1.2)
BUN / CREAT RATIO: 19 (ref 9–23)
BUN: 15 mg/dL (ref 6–24)
CHLORIDE: 101 mmol/L (ref 96–106)
CO2: 25 mmol/L (ref 20–29)
CREATININE: 0.78 mg/dL (ref 0.57–1.00)
Calcium: 9.7 mg/dL (ref 8.7–10.2)
GFR calc Af Amer: 105 mL/min/{1.73_m2} (ref >59)
GFR calc non Af Amer: 91 mL/min/{1.73_m2} (ref >59)
GLOBULIN, TOTAL: 3 g/dL (ref 1.5–4.5)
GLUCOSE: 99 mg/dL (ref 65–99)
Potassium: 4.8 mmol/L (ref 3.5–5.2)
SODIUM: 141 mmol/L (ref 134–144)
Total Protein: 7.6 g/dL (ref 6.0–8.5)

## 2016-11-16 DIAGNOSIS — E663 Overweight: Secondary | ICD-10-CM | POA: Diagnosis not present

## 2016-11-25 DIAGNOSIS — Z5181 Encounter for therapeutic drug level monitoring: Secondary | ICD-10-CM | POA: Diagnosis not present

## 2016-11-25 DIAGNOSIS — Z713 Dietary counseling and surveillance: Secondary | ICD-10-CM | POA: Diagnosis not present

## 2016-11-25 DIAGNOSIS — E663 Overweight: Secondary | ICD-10-CM | POA: Diagnosis not present

## 2016-11-30 ENCOUNTER — Ambulatory Visit
Admission: RE | Admit: 2016-11-30 | Discharge: 2016-11-30 | Disposition: A | Discharge status: Home / Self Care | Payer: BLUE CROSS/BLUE SHIELD | Source: Ambulatory Visit | Attending: Obstetrics and Gynecology | Admitting: Obstetrics and Gynecology

## 2016-11-30 DIAGNOSIS — Z1231 Encounter for screening mammogram for malignant neoplasm of breast: Secondary | ICD-10-CM | POA: Diagnosis not present

## 2016-12-17 ENCOUNTER — Other Ambulatory Visit: Payer: Self-pay | Admitting: Obstetrics and Gynecology

## 2016-12-17 DIAGNOSIS — E2839 Other primary ovarian failure: Secondary | ICD-10-CM

## 2016-12-21 ENCOUNTER — Other Ambulatory Visit: Payer: BLUE CROSS/BLUE SHIELD

## 2016-12-21 ENCOUNTER — Ambulatory Visit
Admission: RE | Admit: 2016-12-21 | Discharge: 2016-12-21 | Disposition: A | Discharge status: Home / Self Care | Payer: BLUE CROSS/BLUE SHIELD | Source: Ambulatory Visit | Attending: Obstetrics and Gynecology | Admitting: Obstetrics and Gynecology

## 2016-12-21 DIAGNOSIS — Z13820 Encounter for screening for osteoporosis: Secondary | ICD-10-CM | POA: Diagnosis not present

## 2016-12-21 DIAGNOSIS — E2839 Other primary ovarian failure: Secondary | ICD-10-CM

## 2016-12-21 DIAGNOSIS — Z780 Asymptomatic menopausal state: Secondary | ICD-10-CM | POA: Diagnosis not present

## 2017-01-26 ENCOUNTER — Telehealth: Payer: Self-pay | Admitting: Obstetrics and Gynecology

## 2017-01-26 NOTE — Telephone Encounter (Signed)
I agree

## 2017-01-26 NOTE — Telephone Encounter (Signed)
Left message to call Nuvia Hileman at [phone].  

## 2017-01-26 NOTE — Telephone Encounter (Signed)
Patient having left lower abdominal pain with swelling and fever and dizzy.

## 2017-01-26 NOTE — Telephone Encounter (Signed)
Spoke with patients spouse Brittany Kelly, not listed on current dpr. Returned call using spanish interpreter [phone]. Verbal confirmation received from patient to speak with spouse, Brittany Kelly.  Patient states she has been experiencing left sided abdominal pain for "several days". Patient feels abdomen full and warm, "like 2-3 months pregnant". Feels warm, has not checked temperature, taking tylenol for pain 5/10.  Voiding small amounts, strong smell to urine.   Denies N/V, bleeding or vaginal d/c.   Hx of hysterectomy.  Recommended patient seek care at local ER/Urgent care, paitent declined. Patient states she has had UTI in the past and this is what it feels like, requesting OV. Advised patient Dr. Quincy Simmonds is out of the office, can schedule with covering provider. Patient scheduled for OV on 9/12 at 9am with Dr. Talbert Nan.   Again, advised patient should symptoms worsen, if fever is present/develops, N/V develops, or inability to urinate, seek care at local ER/Urgent care immediately. Advised will review with Dr. Talbert Nan and return call with any additional recommendations. Patient verbalizes understanding and is agreeable.   Dr. Talbert Nan -please review, any additional recommendations?   Cc: Dr. Quincy Simmonds

## 2017-01-27 ENCOUNTER — Encounter: Payer: Self-pay | Admitting: Obstetrics and Gynecology

## 2017-01-27 ENCOUNTER — Encounter: Payer: Self-pay | Admitting: Family Medicine

## 2017-01-27 ENCOUNTER — Ambulatory Visit: Payer: BLUE CROSS/BLUE SHIELD | Admitting: Family Medicine

## 2017-01-27 ENCOUNTER — Ambulatory Visit (INDEPENDENT_AMBULATORY_CARE_PROVIDER_SITE_OTHER): Payer: BLUE CROSS/BLUE SHIELD | Admitting: Family Medicine

## 2017-01-27 ENCOUNTER — Telehealth: Payer: Self-pay

## 2017-01-27 ENCOUNTER — Ambulatory Visit (INDEPENDENT_AMBULATORY_CARE_PROVIDER_SITE_OTHER): Payer: BLUE CROSS/BLUE SHIELD | Admitting: Obstetrics and Gynecology

## 2017-01-27 VITALS — BP 110/60 | HR 80 | Temp 98.5°F | Resp 14 | Wt 184.0 lb

## 2017-01-27 VITALS — BP 106/71 | HR 63 | Temp 98.4°F | Resp 20 | Wt 184.8 lb

## 2017-01-27 DIAGNOSIS — R1032 Left lower quadrant pain: Secondary | ICD-10-CM

## 2017-01-27 DIAGNOSIS — R300 Dysuria: Secondary | ICD-10-CM

## 2017-01-27 DIAGNOSIS — R3129 Other microscopic hematuria: Secondary | ICD-10-CM

## 2017-01-27 DIAGNOSIS — R109 Unspecified abdominal pain: Secondary | ICD-10-CM | POA: Diagnosis not present

## 2017-01-27 LAB — POCT URINALYSIS DIPSTICK
BILIRUBIN UA: NEGATIVE
GLUCOSE UA: NEGATIVE
Ketones, UA: NEGATIVE
Leukocytes, UA: NEGATIVE
Nitrite, UA: NEGATIVE
Protein, UA: NEGATIVE
Urobilinogen, UA: NEGATIVE E.U./dL — AB
pH, UA: 6.5 (ref 5.0–8.0)

## 2017-01-27 LAB — CBC WITH DIFFERENTIAL/PLATELET
BASOS ABS: 0.1 10*3/uL (ref 0.0–0.2)
Basos: 1 %
EOS (ABSOLUTE): 0.5 10*3/uL — AB (ref 0.0–0.4)
Eos: 7 %
HEMATOCRIT: 36.9 % (ref 34.0–46.6)
Hemoglobin: 13 g/dL (ref 11.1–15.9)
LYMPHS ABS: 1.8 10*3/uL (ref 0.7–3.1)
Lymphs: 26 %
MCH: 29.3 pg (ref 26.6–33.0)
MCHC: 35.2 g/dL (ref 31.5–35.7)
MCV: 83 fL (ref 79–97)
MONOS ABS: 0.8 10*3/uL (ref 0.1–0.9)
Monocytes: 11 %
NEUTROS ABS: 3.8 10*3/uL (ref 1.4–7.0)
NEUTROS PCT: 53 %
Platelets: 333 10*3/uL (ref 150–379)
RBC: 4.43 x10E6/uL (ref 3.77–5.28)
RDW: 13.8 % (ref 12.3–15.4)
WBC: 6.9 10*3/uL (ref 3.4–10.8)

## 2017-01-27 LAB — IMMATURE CELLS: Bands(Auto) Relative: 2 %

## 2017-01-27 LAB — POC HEMOCCULT BLD/STL (OFFICE/1-CARD/DIAGNOSTIC): FECAL OCCULT BLD: NEGATIVE

## 2017-01-27 LAB — C-REACTIVE PROTEIN: CRP: 1.1 mg/dL (ref 0.5–20.0)

## 2017-01-27 MED ORDER — CIPROFLOXACIN HCL 500 MG PO TABS: 500.0000 mg | ORAL_TABLET | Freq: Two times a day (BID) | ORAL | 0 refills | Status: DC

## 2017-01-27 MED ORDER — METRONIDAZOLE 500 MG PO TABS: 500.0000 mg | ORAL_TABLET | Freq: Three times a day (TID) | ORAL | 0 refills | Status: DC

## 2017-01-27 MED ORDER — NAPROXEN 500 MG PO TABS: 500.0000 mg | ORAL_TABLET | Freq: Two times a day (BID) | ORAL | 0 refills | Status: DC

## 2017-01-27 NOTE — Telephone Encounter (Signed)
FYI: Dr.Wallace's office called stating that the patient decided to she her own primary Dr.Kuneff instead of Dr.Wallace.

## 2017-01-27 NOTE — Progress Notes (Signed)
Brittany Kelly , 28-Mar-1971, 46 y.o., female MRN: [id number] Patient Care Team    Relationship Specialty Notifications Start End  Ma Hillock, DO PCP - General Family Medicine  10/03/15   Janne Lab, MD  Obstetrics and Gynecology  10/03/15     Chief Complaint  Patient presents with  . Abdominal Pain    LLQ x 5 days     Subjective: Pt presents for an OV with complaints of acute severe abdominal pain of 5 days duration.  She had been in the [place] on vacation, and left early secondary to her discomfort. She is with her husband today who also gives much of the history. She states she was eating very healthy with fresh fruits, vegetables and fishes while on vacation. Associated symptoms include fever, chills, bloating, LLQ pain and suprapubic pressure. She endorses having difficulty urinating, creating pressure. She has Had no changes to her bowel movements, no hematochezia or melena. Bowel movements are regular, last bowel movement this morning. Urine was tested at her gynecologist today, and it did not appear infectious. FOBT negative. CBC also collected at gynecology's office and within normal limits. They are sending the urine  for culture. Patient has tried Tylenol and Motrin for discomfort, which is helpful. She has had a cesarean section, hysterectomy with bilateral salpingectomy/oophorectomy and cholecystectomy in the past.  Depression screen [facility] 2/9 01/27/2017 10/03/2015  Decreased Interest 0 0  Down, Depressed, Hopeless 0 0  PHQ - 2 Score 0 0    No Known Allergies Social History  Substance Use Topics  . Smoking status: Never Smoker  . Smokeless tobacco: Never Used  . Alcohol use 2.4 oz/week    4 Standard drinks or equivalent per week   Past Medical History:  Diagnosis Date  . Allergic rhinitis   . Allergy   . Medical history non-contributory   . PONV (postoperative nausea and vomiting)    Past Surgical History:  Procedure Laterality Date  . CESAREAN  SECTION  1996  . cholecystectomy    . CHOLECYSTECTOMY    . CYST EXCISION Bilateral 1995   under both arms  . CYSTOSCOPY N/A 11/21/2013   Procedure: CYSTOSCOPY;  Surgeon: Jamey Reas de Berton Lan, MD;  Location: [facility];  Service: Gynecology;  Laterality: N/A;  . ROBOTIC ASSISTED TOTAL HYSTERECTOMY WITH BILATERAL SALPINGO OOPHERECTOMY Bilateral 11/21/2013   Procedure: ROBOTIC ASSISTED TOTAL HYSTERECTOMY WITH BILATERAL SALPINGO OOPHORECTOMY;  Surgeon: Jamey Reas de Berton Lan, MD;  Location: [facility];  Service: Gynecology;  Laterality: Bilateral;   Family History  Problem Relation Age of Onset  . Diabetes Mother   . Hypertension Mother   . Arthritis Mother   . Hypertension Father   . Arthritis Father    Allergies as of 01/27/2017   No Known Allergies     Medication List       Accurate as of 01/27/17 12:59 PM. Always use your most recent med list.          conjugated estrogens vaginal cream Commonly known as:  PREMARIN USE 1/2 GRAM VAGINALLY TWICE PER WEEK AT BEDTIME   estradiol 0.0375 MG/24HR Commonly known as:  VIVELLE-DOT Place 1 patch onto the skin 2 (two) times a week.   famotidine 20 MG tablet Commonly known as:  PEPCID Take 1 tablet (20 mg total) by mouth daily.   fluticasone 50 MCG/ACT nasal spray Commonly known as:  FLONASE Place 1 spray into both nostrils daily.   ibuprofen 800 MG  tablet Commonly known as:  ADVIL,MOTRIN Take 800 mg by mouth as needed.   omeprazole 40 MG capsule Commonly known as:  PRILOSEC Take 1 capsule (40 mg total) by mouth daily.   XIIDRA 5 % Soln Generic drug:  Lifitegrast Place 1 drop into both eyes daily.       All past medical history, surgical history, allergies, family history, immunizations andmedications were updated in the EMR today and reviewed under the history and medication portions of their EMR.     ROS: Negative, with the exception of above mentioned in HPI   Objective:  BP 106/71 (BP  Location: Right Arm, Patient Position: Sitting, Cuff Size: Large)   Pulse 63   Temp 98.4 F (36.9 C)   Resp 20   Wt 184 lb 12 oz (83.8 kg)   LMP 10/14/2013   SpO2 98%   BMI 32.73 kg/m  Body mass index is 32.73 kg/m. Gen: Afebrile. No acute distress. Nontoxic in appearance, well developed, well nourished.  HENT: AT. [place].  MMM, no oral lesions.  Eyes:Pupils Equal Round Reactive to light, Extraocular movements intact,  Conjunctiva without redness, discharge or icterus. CV: RRR  Chest: CTAB, no wheeze or crackles. Good air movement, normal resp effort.  Abd: Soft. Obese. Nondistended, tender to palpation epigastric region and left lower quadrant. BS present. No Masses palpated. Neg murphy, No TTP McBurney. No rebound, mild guarding present. Skin: no rashes, purpura or petechiae.  Neuro: Normal gait. PERLA. EOMi. Alert. Oriented x3   No exam data present No results found. Results for orders placed or performed in visit on 01/27/17 (from the past 24 hour(s))  POCT Urinalysis Dipstick     Status: Abnormal   Collection Time: 01/27/17  9:15 AM  Result Value Ref Range   Color, UA yellow    Clarity, UA clear    Glucose, UA neg    Bilirubin, UA neg    Ketones, UA neg    Spec Grav, UA  1.010 - 1.025   Blood, UA +    pH, UA 6.5 5.0 - 8.0   Protein, UA neg    Urobilinogen, UA negative (A) 0.2 or 1.0 E.U./dL   Nitrite, UA neg    Leukocytes, UA Negative Negative  POC Hemoccult Bld/Stl (1-Cd Office Dx)     Status: Normal   Collection Time: 01/27/17  9:41 AM  Result Value Ref Range   Card #1 Date 01-27-17    Fecal Occult Blood, POC Negative Negative  CBC with Differential/Platelet     Status: Abnormal   Collection Time: 01/27/17 10:02 AM  Result Value Ref Range   WBC 6.9 3.4 - 10.8 x10E3/uL   RBC 4.43 3.77 - 5.28 x10E6/uL   Hemoglobin 13.0 11.1 - 15.9 g/dL   Hematocrit 36.9 34.0 - 46.6 %   MCV 83 79 - 97 fL   MCH 29.3 26.6 - 33.0 pg   MCHC 35.2 31.5 - 35.7 g/dL   RDW 13.8 12.3 - 15.4  %   Platelets 333 150 - 379 x10E3/uL   Neutrophils 53 Not Estab. %   Lymphs 26 Not Estab. %   Monocytes 11 Not Estab. %   Eos 7 Not Estab. %   Basos 1 Not Estab. %   Immature Cells Note    Neutrophils Absolute 3.8 1.4 - 7.0 x10E3/uL   Lymphocytes Absolute 1.8 0.7 - 3.1 x10E3/uL   Monocytes Absolute 0.8 0.1 - 0.9 x10E3/uL   EOS (ABSOLUTE) 0.5 (H) 0.0 - 0.4 x10E3/uL  Basophils Absolute 0.1 0.0 - 0.2 x10E3/uL   Hematology Comments: Note:    Narrative   Performed at:  [facility] [address]  [place], [place], [place]  [id number] Lab Director: Lindon Romp MD, Phone:  [phone] Specimen Comment: 01/27/17 STAT RESULT CALLED/FAXED TO [facility] BY [email or web address]  Immature Cells     Status: None   Collection Time: 01/27/17 10:02 AM  Result Value Ref Range   Bands(Auto) Relative 2 Not Estab. %   Narrative   Performed at:  [facility]  [place], [place], [place]  [id number] Lab Director: Lindon Romp MD, Phone:  [phone] Specimen Comment: 01/27/17 STAT RESULT CALLED/FAXED TO [facility] BY [email or web address] [email or web address]    Assessment/Plan: Bonnita Newby is a 46 y.o. female present for OV for  LLQ pain/Abdominal pain, unspecified abdominal location Discussed options with patient and her husband today, she does have left lower quadrant pain, but on exam she also is tender in the epigastric area as well. Patient is tolerating food/drink, having normal bowel movements. It seems like most of her discomfort is surrounding her bladder area and she has hematuria. But cannot rule out diverticulitis given deep left lower quadrant pain to palpation and reports a fever/chills. After options discussed, given patient's level of discomfort will obtain CT imaging of her abdomen. Start Cipro/Flagyl treatment for potential diverticulitis, which would hopefully also cover if she has a urinary infection by culture. - C-reactive protein, add on lipase.  - CT Abdomen  Pelvis W Contrast; Future - Patient was instructed to go straight to the emergency room if she has worsening abdominal pain before CT imaging is able to be completed. They voiced understanding. - Close follow-up recommended, depending upon CT imaging results.  Reviewed expectations re: course of current medical issues.  Discussed self-management of symptoms.  Outlined signs and symptoms indicating need for more acute intervention.  Patient verbalized understanding and all questions were answered.  Patient received an After-Visit Summary.    No orders of the defined types were placed in this encounter.  Note is dictated utilizing voice recognition software. Although note has been proof read prior to signing, occasional typographical errors still can be missed. If any questions arise, please do not hesitate to call for verification.   electronically signed by:  Howard Pouch, DO  [place]

## 2017-01-27 NOTE — Patient Instructions (Signed)
I have called in 2 antibiotics for you to start today. Take as directed on the label.  Take naproxen every 12 hours as needed for pain.   I have ordered a CT of your abdomen to further investigate cause of your pain.   We will call you with all results once they are received.   This may be Diverticulosis, but I am not certain given your blood and urine does not appear infectious.    Diverticulitis Diverticulitis is when small pockets in your large intestine (colon) get infected or swollen. This causes stomach pain and watery poop (diarrhea). These pouches are called diverticula. They form in people who have a condition called diverticulosis. Follow these instructions at home: Medicines  Take over-the-counter and prescription medicines only as told by your doctor. These include: ? Antibiotics. ? Pain medicines. ? Fiber pills. ? Probiotics. ? Stool softeners.  Do not drive or use heavy machinery while taking prescription pain medicine.  If you were prescribed an antibiotic, take it as told. Do not stop taking it even if you feel better. General instructions  Follow a diet as told by your doctor.  When you feel better, your doctor may tell you to change your diet. You may need to eat a lot of fiber. Fiber makes it easier to poop (have bowel movements). Healthy foods with fiber include: ? Berries. ? Beans. ? Lentils. ? Green vegetables.  Exercise 3 or more times a week. Aim for 30 minutes each time. Exercise enough to sweat and make your heart beat faster.  Keep all follow-up visits as told. This is important. You may need to have an exam of the large intestine. This is called a colonoscopy. Contact a doctor if:  Your pain does not get better.  You have a hard time eating or drinking.  You are not pooping like normal. Get help right away if:  Your pain gets worse.  Your problems do not get better.  Your problems get worse very fast.  You have a fever.  You throw  up (vomit) more than one time.  You have poop that is: ? Bloody. ? Black. ? Tarry. Summary  Diverticulitis is when small pockets in your large intestine (colon) get infected or swollen.  Take medicines only as told by your doctor.  Follow a diet as told by your doctor. This information is not intended to replace advice given to you by your health care provider. Make sure you discuss any questions you have with your health care provider. Document Released: 10/21/2007 Document Revised: 05/21/2016 Document Reviewed: 05/21/2016 Elsevier Interactive Patient Education  2017 [employer].

## 2017-01-27 NOTE — Progress Notes (Signed)
GYNECOLOGY  VISIT   HPI: 46 y.o.   Married  Hispanic  female   G1P1001 with Patient's last menstrual period was 10/14/2013.   here c/o dysuria, LLQ pain, headache, fever and bloating.   She has been having LLQ abdominal pain for about a week, bloating, felt feverish but didn't have a thermometer. She came back from vacation early (from [place] because of the pain. The pain is intermittent, pressure, hot pain, the pain is 4/10 currently, last week it was a 7/10 in severity. She is not voiding frequently (actually infrequently), no urgency, some dysuria, slight odor. No diarrhea or constipation, no nausea or emesis.  H/O hysterectomy, no d/c, no vaginal bleeding.   GYNECOLOGIC HISTORY: Patient's last menstrual period was 10/14/2013. Contraception:hysterectomy Menopausal hormone therapy: Premarin, Estradiol         OB History    Gravida Para Term Preterm AB Living   1 1 1     1    SAB TAB Ectopic Multiple Live Births                     Patient Active Problem List   Diagnosis Date Noted  . Elevated liver enzymes 10/03/2015  . Abdominal pain, chronic, epigastric 10/03/2015  . On postmenopausal hormone replacement therapy 10/03/2015    Past Medical History:  Diagnosis Date  . Allergic rhinitis   . Allergy   . Medical history non-contributory   . PONV (postoperative nausea and vomiting)     Past Surgical History:  Procedure Laterality Date  . CESAREAN SECTION  1996  . cholecystectomy    . CHOLECYSTECTOMY    . CYST EXCISION Bilateral 1995   under both arms  . CYSTOSCOPY N/A 11/21/2013   Procedure: CYSTOSCOPY;  Surgeon: Jamey Reas de Berton Lan, MD;  Location: [facility];  Service: Gynecology;  Laterality: N/A;  . ROBOTIC ASSISTED TOTAL HYSTERECTOMY WITH BILATERAL SALPINGO OOPHERECTOMY Bilateral 11/21/2013   Procedure: ROBOTIC ASSISTED TOTAL HYSTERECTOMY WITH BILATERAL SALPINGO OOPHORECTOMY;  Surgeon: Jamey Reas de Berton Lan, MD;  Location: [facility];  Service: Gynecology;  Laterality: Bilateral;    Current Outpatient Prescriptions  Medication Sig Dispense Refill  . conjugated estrogens (PREMARIN) vaginal cream USE 1/2 GRAM VAGINALLY TWICE PER WEEK AT BEDTIME 30 g 2  . estradiol (VIVELLE-DOT) 0.0375 MG/24HR Place 1 patch onto the skin 2 (two) times a week. 24 patch 3  . famotidine (PEPCID) 20 MG tablet Take 1 tablet (20 mg total) by mouth daily. 30 tablet 1  . fluticasone (FLONASE) 50 MCG/ACT nasal spray Place 1 spray into both nostrils daily. 16 g 5  . ibuprofen (ADVIL,MOTRIN) 800 MG tablet Take 800 mg by mouth as needed.  0  . omeprazole (PRILOSEC) 40 MG capsule Take 1 capsule (40 mg total) by mouth daily. 30 capsule 2  . XIIDRA 5 % SOLN Place 1 drop into both eyes daily.  6   No current facility-administered medications for this visit.      ALLERGIES: Patient has no known allergies.  Family History  Problem Relation Age of Onset  . Diabetes Mother   . Hypertension Mother   . Arthritis Mother   . Hypertension Father   . Arthritis Father     Social History   Social History  . Marital status: Married    Spouse name: N/A  . Number of children: N/A  . Years of education: N/A   Occupational History  . Not on file.   Social  History Main Topics  . Smoking status: Never Smoker  . Smokeless tobacco: Never Used  . Alcohol use 2.4 oz/week    4 Standard drinks or equivalent per week  . Drug use: No  . Sexual activity: Yes    Partners: Male    Birth control/ protection: Surgical     Comment: R-TAH/BSO   Other Topics Concern  . Not on file   Social History Narrative   Married to [employer] 1 child.    Some college. Youth worker.   Drinks caffeine.    Wears her seatbelt.   Exercises > 3x week.    Guns in the home in a locked cabinet.    Feels safe in her relationships.     Review of Systems  Constitutional: Positive for fever.  HENT: Negative.   Eyes: Negative.   Respiratory: Negative.    Cardiovascular: Negative.   Gastrointestinal: Negative.        Bloating and abdominal swelling  Genitourinary: Positive for dysuria.       LLQ pain  Musculoskeletal: Negative.   Skin: Negative.   Neurological: Positive for headaches.  Endo/Heme/Allergies: Negative.   Psychiatric/Behavioral: Negative.     PHYSICAL EXAMINATION:    BP 110/60 (BP Location: Right Arm, Patient Position: Sitting, Cuff Size: Normal)   Pulse 80   Resp 14   Wt 184 lb (83.5 kg)   LMP 10/14/2013   BMI 32.59 kg/m     General appearance: alert, cooperative and appears stated age Abdomen: soft, tender in the LLQ, no rebound, no guarding, not distended, bowel sounds normal; no masses,  no organomegaly CVA: not tender  Pelvic: External genitalia:  no lesions              Urethra:  normal appearing urethra with no masses, tenderness or lesions              Bartholins and Skenes: normal                 Vagina: normal appearing vagina with normal color and discharge, no lesions              Cervix: absent              Bimanual Exam:  Uterus:  uterus absent              Adnexa: no masses, tender in the left adnexa              Rectovaginal: Yes.  .  Confirms.              Anus:  normal sphincter tone, no lesions  Bladder: tender, not as tender as her LLQ  Chaperone was present for exam.  ASSESSMENT LLQ abdominal pain, concern for diverticulitis H/O TLH/BSO Mild dysuria, Urine dip negative other than 1+ blood    PLAN CBC with diff Urine for ua, c&s Concern for diverticulitis, will try and get her into see primary MD today, declines urgent care or ER visit If unable to get her in with primary, will get a CT scan She will take tylenol for pain   An After Visit Summary was printed and given to the patient.  Over 20 minutes face to face time of which over 50% was spent in counseling.   CC: Dr Juleen China, Dr Quincy Simmonds

## 2017-01-27 NOTE — Progress Notes (Signed)
Patient scheduled while in office, spouse present. Spoke with Hailey at [facility]. Patient scheduled for OV today at 1pm with Dr. Juleen China. Patient  aware of appointment and provided contact information. Patient verbalizes understanding and is agreeable.

## 2017-01-27 NOTE — Telephone Encounter (Signed)
Left message to call [place] at [phone].  Need to advise patient her CBC with differential/platelet is normal. This results is available in [facility] for PCP to see.

## 2017-01-28 ENCOUNTER — Other Ambulatory Visit (INDEPENDENT_AMBULATORY_CARE_PROVIDER_SITE_OTHER): Payer: BLUE CROSS/BLUE SHIELD

## 2017-01-28 ENCOUNTER — Telehealth: Payer: Self-pay | Admitting: Family Medicine

## 2017-01-28 ENCOUNTER — Encounter (HOSPITAL_BASED_OUTPATIENT_CLINIC_OR_DEPARTMENT_OTHER): Payer: Self-pay

## 2017-01-28 ENCOUNTER — Ambulatory Visit (HOSPITAL_BASED_OUTPATIENT_CLINIC_OR_DEPARTMENT_OTHER)
Admission: RE | Admit: 2017-01-28 | Discharge: 2017-01-28 | Disposition: A | Discharge status: Home / Self Care | Payer: BLUE CROSS/BLUE SHIELD | Source: Ambulatory Visit | Attending: Family Medicine | Admitting: Family Medicine

## 2017-01-28 ENCOUNTER — Encounter: Payer: Self-pay | Admitting: *Deleted

## 2017-01-28 DIAGNOSIS — R109 Unspecified abdominal pain: Secondary | ICD-10-CM

## 2017-01-28 DIAGNOSIS — R1032 Left lower quadrant pain: Secondary | ICD-10-CM | POA: Diagnosis not present

## 2017-01-28 DIAGNOSIS — R319 Hematuria, unspecified: Secondary | ICD-10-CM

## 2017-01-28 DIAGNOSIS — N1330 Unspecified hydronephrosis: Secondary | ICD-10-CM | POA: Insufficient documentation

## 2017-01-28 DIAGNOSIS — R195 Other fecal abnormalities: Secondary | ICD-10-CM | POA: Diagnosis not present

## 2017-01-28 DIAGNOSIS — R34 Anuria and oliguria: Secondary | ICD-10-CM

## 2017-01-28 DIAGNOSIS — N1339 Other hydronephrosis: Secondary | ICD-10-CM

## 2017-01-28 LAB — URINALYSIS, MICROSCOPIC ONLY
Bacteria, UA: NONE SEEN
CASTS: NONE SEEN /LPF
RBC MICROSCOPIC, UA: NONE SEEN /HPF (ref 0–?)

## 2017-01-28 LAB — BASIC METABOLIC PANEL
BUN: 17 mg/dL (ref 6–23)
CHLORIDE: 102 meq/L (ref 96–112)
CO2: 28 mEq/L (ref 19–32)
CREATININE: 0.65 mg/dL (ref 0.40–1.20)
Calcium: 9.6 mg/dL (ref 8.4–10.5)
GFR: 103.57 mL/min (ref >60.00)
Glucose, Bld: 105 mg/dL — ABNORMAL HIGH (ref 70–99)
POTASSIUM: 4.4 meq/L (ref 3.5–5.1)
Sodium: 140 mEq/L (ref 135–145)

## 2017-01-28 LAB — LIPASE: Lipase: 7 U/L — ABNORMAL LOW (ref 11.0–59.0)

## 2017-01-28 LAB — URINE CULTURE

## 2017-01-28 MED ORDER — IOPAMIDOL (ISOVUE-300) INJECTION 61%
100.0000 mL | Freq: Once | INTRAVENOUS | Status: AC | PRN
  Administered 2017-01-28: 100 mL via INTRAVENOUS

## 2017-01-28 NOTE — Telephone Encounter (Signed)
My Chart message sent. Please add BMP to labs drawn yesterday per Dr Raoul Pitch verbal order.

## 2017-01-28 NOTE — Telephone Encounter (Signed)
BMP add on request faxed to [facility]

## 2017-01-28 NOTE — Telephone Encounter (Signed)
Spoke with patient using [employer] interpreter [phone]. Advised patient of both results as seen below per Dr. Talbert Nan. Patient states she is feeling a little better, was diagnosed with "kidney stones and colon inflammation". Was referred to Urology for f/u, waiting on appointment. Patient aware to return call to office with any additional questions/concerns, verbalizes understanding and is agreeable.   Notes recorded by Salvadore Dom, MD on 01/28/2017 at 12:54 PM EDT Please inform the patient that her urinalysis was negative for blood and see if she is feeling better since starting antibiotics with her primary yesterday (for diverticulitis)  Routing to provider for final review. Patient is agreeable to disposition. Will close encounter.  Cc: Dr. Quincy Simmonds

## 2017-01-28 NOTE — Telephone Encounter (Signed)
Pt CT positive for hydronephrosis (right) and large stool burden rectosigmoid area. Spoke with her husband and discussed further treatment. He reported understanding.  - Miralax 1 cap in 8 ounces water daily recommended.  - labs normal (CBC/CRP/lipase), BMP added awaiting results.  - Referral to urologist placed urgently.  - Continue cipro until completion and DC flagyl.  - continue naproxen for pain as BID as needed - report to ED immediately if pain worsens, fever/chills or decrease in urine output.   Please copy the above  into a mychart message for them, per his request, so he can see the names of medicines when he is home and able to see the labels.

## 2017-03-23 DIAGNOSIS — N1330 Unspecified hydronephrosis: Secondary | ICD-10-CM | POA: Diagnosis not present

## 2017-03-24 ENCOUNTER — Telehealth: Payer: Self-pay | Admitting: Obstetrics and Gynecology

## 2017-03-24 NOTE — Telephone Encounter (Signed)
Spoke with patients spouse "Marchia Bond", ok per current dpr. Marvin request OV to discuss urology recommendations with Dr. Quincy Simmonds. Marchia Bond states wife does not have any current gyn complaints. Requesting OV in 2 weeks. Scheduled for 04/07/17 at 2pm with Dr. Quincy Simmonds, declined earlier appointments offered.   Carolin Guernsey would update Dr. Quincy Simmonds and return call with any additional recommendations, verbalizes understanding and is agreeable.   Routing to provider for final review. Patient is agreeable to disposition. Will close encounter.

## 2017-03-24 NOTE — Telephone Encounter (Signed)
Patient's Husband Marchia Bond called and states his wife is having some problems with her uterus and would like to speak with a nurse

## 2017-04-07 ENCOUNTER — Encounter: Payer: Self-pay | Admitting: Obstetrics and Gynecology

## 2017-04-07 ENCOUNTER — Ambulatory Visit (INDEPENDENT_AMBULATORY_CARE_PROVIDER_SITE_OTHER): Payer: BLUE CROSS/BLUE SHIELD | Admitting: Obstetrics and Gynecology

## 2017-04-07 VITALS — BP 122/76 | HR 76 | Ht 63.0 in | Wt 179.0 lb

## 2017-04-07 DIAGNOSIS — N1330 Unspecified hydronephrosis: Secondary | ICD-10-CM | POA: Diagnosis not present

## 2017-04-07 NOTE — Progress Notes (Signed)
Patient scheduled while in office, spouse present. Spoke with Ashok Croon at [facility] for evaluation of hydronephrosis of right kidney. Patient scheduled for 04/30/17 at 8:15am with Dr. Matilde Sprang. Request copy of consult be faxed to Dr. Quincy Simmonds at [phone]. Patient and spouse verbalizes understanding and is agreeable to date and time.

## 2017-04-07 NOTE — Progress Notes (Signed)
GYNECOLOGY  VISIT   HPI: 46 y.o.   Married  Hispanic  female   G1P1001 with Patient's last menstrual period was 10/14/2013.   here for consult to discuss CT scan results and recommendations.  Husband present for the visit today.  Returned from the [place] ago, and developed abdominal pressure in the left lower quadrant.   Seen by Dr. Talbert Nan for LLQ pain and hematuria on 01/27/17. (Final urine micro and culture and hemoccult with Dr. Talbert Nan were negative.) Dr. Talbert Nan was concerned for diverticulitis and sent the patient to her PCP.    CT ordered by PCP showing right hydronephrosis and moderate to large stool burden in the rectosigmoid colon.  No diverticulosis or diverticulitis seen.  Status post hysterectomy.  No adnexal masses noted.   The pressure in her left lower quadrant resolved.  She and her husband attribute this to her diet while they were traveling.  Their concern is the renal findings and a consultation they had with a urologist at [facility].  Surgery was recommended which sounds like cystoscopy with stent placement.  They are unclear about the objective and necessity of the surgery.   GYNECOLOGIC HISTORY: Patient's last menstrual period was 10/14/2013. Contraception:  Hysterectomy Menopausal hormone therapy: Premarin vaginal cream, Vivelle Dot 0.0375mg   Last mammogram: 11-30-16 Density C/Neg/BiRads1:TBC Last pap smear: 10-18-13 Neg:Neg HR HPV        OB History    Gravida Para Term Preterm AB Living   1 1 1     1    SAB TAB Ectopic Multiple Live Births                     Patient Active Problem List   Diagnosis Date Noted  . Hydronephrosis 01/28/2017  . Elevated liver enzymes 10/03/2015  . Abdominal pain, chronic, epigastric 10/03/2015  . On postmenopausal hormone replacement therapy 10/03/2015    Past Medical History:  Diagnosis Date  . Allergic rhinitis   . PONV (postoperative nausea and vomiting)     Past Surgical History:   Procedure Laterality Date  . CESAREAN SECTION  1996  . CHOLECYSTECTOMY  10/04/2013  . CYST EXCISION Bilateral 1995   under both arms  . CYSTOSCOPY N/A 11/21/2013   Procedure: CYSTOSCOPY;  Surgeon: Jamey Reas de Berton Lan, MD;  Location: [facility];  Service: Gynecology;  Laterality: N/A;  . ROBOTIC ASSISTED TOTAL HYSTERECTOMY WITH BILATERAL SALPINGO OOPHERECTOMY Bilateral 11/21/2013   Procedure: ROBOTIC ASSISTED TOTAL HYSTERECTOMY WITH BILATERAL SALPINGO OOPHORECTOMY;  Surgeon: Jamey Reas de Berton Lan, MD;  Location: [facility];  Service: Gynecology;  Laterality: Bilateral;    Current Outpatient Medications  Medication Sig Dispense Refill  . ciprofloxacin (CIPRO) 500 MG tablet Take 1 tablet (500 mg total) by mouth 2 (two) times daily. 20 tablet 0  . conjugated estrogens (PREMARIN) vaginal cream USE 1/2 GRAM VAGINALLY TWICE PER WEEK AT BEDTIME 30 g 2  . estradiol (VIVELLE-DOT) 0.0375 MG/24HR Place 1 patch onto the skin 2 (two) times a week. 24 patch 3  . famotidine (PEPCID) 20 MG tablet Take 1 tablet (20 mg total) by mouth daily. 30 tablet 1  . fluticasone (FLONASE) 50 MCG/ACT nasal spray Place 1 spray into both nostrils daily. 16 g 5  . ibuprofen (ADVIL,MOTRIN) 800 MG tablet Take 800 mg by mouth as needed.  0  . metroNIDAZOLE (FLAGYL) 500 MG tablet Take 1 tablet (500 mg total) by mouth 3 (three) times daily. 30 tablet 0  .  naproxen (NAPROSYN) 500 MG tablet Take 1 tablet (500 mg total) by mouth 2 (two) times daily with a meal. 30 tablet 0  . omeprazole (PRILOSEC) 40 MG capsule Take 1 capsule (40 mg total) by mouth daily. 30 capsule 2  . XIIDRA 5 % SOLN Place 1 drop into both eyes daily.  6   No current facility-administered medications for this visit.      ALLERGIES: Patient has no known allergies.  Family History  Problem Relation Age of Onset  . Diabetes Mother   . Hypertension Mother   . Arthritis Mother   . Hypertension Father   . Arthritis Father     Social  History   Socioeconomic History  . Marital status: Married    Spouse name: Not on file  . Number of children: Not on file  . Years of education: Not on file  . Highest education level: Not on file  Social Needs  . Financial resource strain: Not on file  . Food insecurity - worry: Not on file  . Food insecurity - inability: Not on file  . Transportation needs - medical: Not on file  . Transportation needs - non-medical: Not on file  Occupational History  . Not on file  Tobacco Use  . Smoking status: Never Smoker  . Smokeless tobacco: Never Used  Substance and Sexual Activity  . Alcohol use: Yes    Alcohol/week: 2.4 oz    Types: 4 Standard drinks or equivalent per week  . Drug use: No  . Sexual activity: Yes    Partners: Male    Birth control/protection: Surgical    Comment: R-TAH/BSO  Other Topics Concern  . Not on file  Social History Narrative   Married to [employer] 1 child.    Some college. Youth worker.   Drinks caffeine.    Wears her seatbelt.   Exercises > 3x week.    Guns in the home in a locked cabinet.    Feels safe in her relationships.     ROS:  Pertinent items are noted in HPI.  PHYSICAL EXAMINATION:    BP 122/76 (BP Location: Right Arm, Patient Position: Sitting, Cuff Size: Normal)   Pulse 76   Ht 5\' 3"  (1.6 m)   Wt 179 lb (81.2 kg)   LMP 10/14/2013   BMI 31.71 kg/m     General appearance: alert, cooperative and appears stated age  ASSESSMENT  Status post robotic TLH with BSO.  Right hydronephrosis.   PLAN  We discussed hydronephrosis and potential etiologies.  I drew a picture and listed potential causes - fibrosis, stones, iatrogenic, congenital.  We discussed cystoscopy and stent placement.  We discussed the importance of preserving renal function.  She would like a second opinion at [facility], and our office will help to facilitate this.  I did mention that a renal ultrasound could be done as a recheck of the  kidney without going through additional radiation exposure.  She and her husband are satisfied with the consultation today and the plan.    An After Visit Summary was printed and given to the patient.  __25____ minutes face to face time of which over 50% was spent in counseling.

## 2017-04-07 NOTE — Patient Instructions (Signed)
Hydronephrosis Hydronephrosis is the enlargement of a kidney due to a blockage that stops urine from flowing out of the body. What are the causes? Common causes of this condition include:  A birth (congenital) defect of the kidney.  A congenital defect of the tube through which urine travels (ureter).  Kidney stones.  An enlarged prostate gland.  A tumor.  Cancer of the prostate, bladder, uterus, ovary, or colon.  A blood clot.  What are the signs or symptoms? Symptoms of this condition include:  Pain or discomfort in your side (flank).  Swelling of the abdomen.  Pain in the abdomen.  Nausea and vomiting.  Fever.  Pain while passing urine.  Feeling of urgency to urinate.  Frequent urination.  Infection of the urinary tract.  In some cases, there are no symptoms. How is this diagnosed? This condition may be diagnosed with:  A medical history.  A physical exam.  Blood and urine tests to check kidney function.  Imaging tests, such as an X-ray, ultrasound, CT scan, or MRI.  A test in which a rigid or flexible telescope (cystoscope) is used to view the site of the blockage.  How is this treated? Treatment for this condition depends on where the blockage is located, how long it has been there, and what caused it. The goal of treatment is to remove the blockage. Treatment options include:  A procedure to put in a soft tube to help drain urine.  Antibiotic medicines to treat or prevent infection.  Shock-wave therapy (lithotripsy) to help eliminate kidney stones.  Follow these instructions at home:  Get lots of rest.  Drink enough fluid to keep your urine clear or pale yellow.  If you have a drain in, follow your health care provider's instructions about how to care for it.  Take medicines only as directed by your health care provider.  If you were prescribed an antibiotic medicine, finish all of it even if you start to feel better.  Keep all  follow-up visits as directed by your health care provider. This is important. Contact a health care provider if:  You continue to have symptoms after treatment.  You develop new symptoms.  You have a problem with a drainage device.  Your urine becomes cloudy or bloody.  You have a fever. Get help right away if:  You have severe flank or abdominal pain.  You develop vomiting and are unable to keep fluids down. This information is not intended to replace advice given to you by your health care provider. Make sure you discuss any questions you have with your health care provider. Document Released: 03/01/2007 Document Revised: 10/10/2015 Document Reviewed: 04/30/2014 Elsevier Interactive Patient Education  2018 Elsevier Inc.  

## 2017-04-30 DIAGNOSIS — N1330 Unspecified hydronephrosis: Secondary | ICD-10-CM | POA: Diagnosis not present

## 2017-10-27 ENCOUNTER — Other Ambulatory Visit: Payer: Self-pay | Admitting: Obstetrics and Gynecology

## 2017-10-27 NOTE — Telephone Encounter (Signed)
Medication refill request: Vivelle Patch  Last AEX:  11-11-16  Next AEX: 11-15-17  Last MMG (if hormonal medication request): 12-01-16 WNL  Refill authorized: please advise

## 2017-11-15 ENCOUNTER — Telehealth: Payer: Self-pay | Admitting: Obstetrics and Gynecology

## 2017-11-15 ENCOUNTER — Ambulatory Visit: Payer: BLUE CROSS/BLUE SHIELD | Admitting: Obstetrics and Gynecology

## 2017-11-15 ENCOUNTER — Encounter: Payer: Self-pay | Admitting: Obstetrics and Gynecology

## 2017-11-15 NOTE — Telephone Encounter (Signed)
Patient canceled her aex appointment and rescheduled to 12/14/17 dur to her flight being delayed. [facility] policy followed.

## 2017-11-15 NOTE — Telephone Encounter (Signed)
Thanks for the update.  Encounter closed.

## 2017-11-25 ENCOUNTER — Other Ambulatory Visit: Payer: Self-pay | Admitting: Obstetrics and Gynecology

## 2017-11-25 DIAGNOSIS — Z1231 Encounter for screening mammogram for malignant neoplasm of breast: Secondary | ICD-10-CM

## 2017-12-14 ENCOUNTER — Ambulatory Visit: Payer: BLUE CROSS/BLUE SHIELD | Admitting: Obstetrics and Gynecology

## 2017-12-14 ENCOUNTER — Other Ambulatory Visit: Payer: Self-pay

## 2017-12-14 ENCOUNTER — Encounter: Payer: Self-pay | Admitting: Obstetrics and Gynecology

## 2017-12-14 VITALS — BP 104/68 | HR 80 | Ht 63.78 in | Wt 180.2 lb

## 2017-12-14 DIAGNOSIS — Z01419 Encounter for gynecological examination (general) (routine) without abnormal findings: Secondary | ICD-10-CM | POA: Diagnosis not present

## 2017-12-14 DIAGNOSIS — Z1211 Encounter for screening for malignant neoplasm of colon: Secondary | ICD-10-CM

## 2017-12-14 MED ORDER — ESTRADIOL 0.05 MG/24HR TD PTTW: 1.0000 | MEDICATED_PATCH | TRANSDERMAL | 3 refills | Status: DC

## 2017-12-14 MED ORDER — ESTROGENS, CONJUGATED 0.625 MG/GM VA CREA: TOPICAL_CREAM | VAGINAL | 2 refills | Status: DC

## 2017-12-14 NOTE — Progress Notes (Signed)
47 y.o. G19P1001 Married Caucasian female here for annual exam.    Having a lot of hot flashes.  Feels the patch is coming off easily.  Lots of hot flashes.  Some bloating.   Wants labs today.   Son moved to [place] for work.  PCP: Howard Pouch, DO    Patient's last menstrual period was 10/14/2013.           Sexually active: Yes.    The current method of family planning is status post hysterectomy.    Exercising: Yes.    tennis, gym Smoker:  no  Health Maintenance: Pap:  10-18-13 Neg:Neg HR HPV History of abnormal Pap:  no MMG:  11/30/2016 Birads 1 negative, scheduled for mammogram on 12/15/2017 Colonoscopy:  n/a BMD:   n/a  Result  n/a TDaP:  Unsure -- possibly up to date Hep C: 10/03/15 Negative Screening Labs: PCP takes care of labs     reports that she has never smoked. She has never used smokeless tobacco. She reports that she drinks about 2.4 oz of alcohol per week. She reports that she does not use drugs.  Past Medical History:  Diagnosis Date  . Allergic rhinitis   . PONV (postoperative nausea and vomiting)     Past Surgical History:  Procedure Laterality Date  . CESAREAN SECTION  1996  . CHOLECYSTECTOMY  10/04/2013  . CYST EXCISION Bilateral 1995   under both arms  . CYSTOSCOPY N/A 11/21/2013   Procedure: CYSTOSCOPY;  Surgeon: Jamey Reas de Berton Lan, MD;  Location: [facility];  Service: Gynecology;  Laterality: N/A;  . ROBOTIC ASSISTED TOTAL HYSTERECTOMY WITH BILATERAL SALPINGO OOPHERECTOMY Bilateral 11/21/2013   Procedure: ROBOTIC ASSISTED TOTAL HYSTERECTOMY WITH BILATERAL SALPINGO OOPHORECTOMY;  Surgeon: Jamey Reas de Berton Lan, MD;  Location: [facility];  Service: Gynecology;  Laterality: Bilateral;    Current Outpatient Medications  Medication Sig Dispense Refill  . conjugated estrogens (PREMARIN) vaginal cream USE 1/2 GRAM VAGINALLY TWICE PER WEEK AT BEDTIME 30 g 2  . fluticasone (FLONASE) 50 MCG/ACT nasal spray Place 1 spray into both  nostrils daily. 16 g 5  . ibuprofen (ADVIL,MOTRIN) 800 MG tablet Take 800 mg by mouth as needed.  0  . [START ON 12/16/2017] estradiol (MINIVELLE) 0.05 MG/24HR patch Place 1 patch (0.05 mg total) onto the skin 2 (two) times a week. 24 patch 3   No current facility-administered medications for this visit.     Family History  Problem Relation Age of Onset  . Diabetes Mother   . Hypertension Mother   . Arthritis Mother   . Hypertension Father   . Arthritis Father     Review of Systems  Constitutional: Negative.   HENT: Negative.   Eyes: Negative.   Respiratory: Negative.   Cardiovascular: Negative.   Gastrointestinal: Negative.   Endocrine: Negative.   Genitourinary:       Hot flashes  Musculoskeletal: Negative.   Skin: Negative.   Allergic/Immunologic: Negative.   Neurological: Negative.   Hematological: Negative.   Psychiatric/Behavioral: Negative.     Exam:   BP 104/68 (BP Location: Right Arm, Patient Position: Sitting)   Pulse 80   Ht 5' 3.78" (1.62 m)   Wt 180 lb 3.2 oz (81.7 kg)   LMP 10/14/2013   BMI 31.15 kg/m     General appearance: alert, cooperative and appears stated age Head: Normocephalic, without obvious abnormality, atraumatic Neck: no adenopathy, supple, symmetrical, trachea midline and thyroid normal to inspection and palpation Lungs:  clear to auscultation bilaterally Breasts: normal appearance, no masses or tenderness, No nipple retraction or dimpling, No nipple discharge or bleeding, No axillary or supraclavicular adenopathy Heart: regular rate and rhythm Abdomen: soft, non-tender; no masses, no organomegaly Extremities: extremities normal, atraumatic, no cyanosis or edema Skin: Skin color, texture, turgor normal. No rashes or lesions Lymph nodes: Cervical, supraclavicular, and axillary nodes normal. No abnormal inguinal nodes palpated Neurologic: Grossly normal  Pelvic: External genitalia:  no lesions              Urethra:  normal appearing  urethra with no masses, tenderness or lesions              Bartholins and Skenes: normal                 Vagina: normal appearing vagina with normal color and discharge, no lesions              Cervix:  Absent.              Pap taken: No. Bimanual Exam:  Uterus: Absent.              Adnexa: no mass, fullness, tenderness              Rectal exam: Yes.    Confirms.              Anus:  normal sphincter tone, no lesions  Chaperone was present for exam.  Assessment:   Well woman visit with normal exam. Status post robotic hysterectomy with BSO. Menopausal symptoms.  Estrogen therapy.  FH osteoporosis.    Plan: Mammogram screening. Recommended self breast awareness. Pap and HR HPV as above. Guidelines for Calcium, Vitamin D, regular exercise program including cardiovascular and weight bearing exercise. Will increase her transdermal estrogen and change her to Minivelle 0.05 mg twice weekly for one year.  We did review the WHI and risks of ERT including stroke, DVT, and PE.  The benefits at her age Shaune Leeks outweigh the risks at this time.  Refill of Premarin cream.  Routine labs.  IFOB. Follow up annually and prn.   After visit summary provided.

## 2017-12-14 NOTE — Patient Instructions (Signed)

## 2017-12-15 ENCOUNTER — Ambulatory Visit
Admission: RE | Admit: 2017-12-15 | Discharge: 2017-12-15 | Disposition: A | Discharge status: Home / Self Care | Payer: BLUE CROSS/BLUE SHIELD | Source: Ambulatory Visit | Attending: Obstetrics and Gynecology | Admitting: Obstetrics and Gynecology

## 2017-12-15 DIAGNOSIS — Z1231 Encounter for screening mammogram for malignant neoplasm of breast: Secondary | ICD-10-CM

## 2017-12-15 LAB — CBC
HEMOGLOBIN: 13.3 g/dL (ref 11.1–15.9)
Hematocrit: 40.9 % (ref 34.0–46.6)
MCH: 28.7 pg (ref 26.6–33.0)
MCHC: 32.5 g/dL (ref 31.5–35.7)
MCV: 88 fL (ref 79–97)
PLATELETS: 379 10*3/uL (ref 150–450)
RBC: 4.63 x10E6/uL (ref 3.77–5.28)
RDW: 13.7 % (ref 12.3–15.4)
WBC: 7.1 10*3/uL (ref 3.4–10.8)

## 2017-12-15 LAB — HEMOGLOBIN A1C
ESTIMATED AVERAGE GLUCOSE: 108 mg/dL
HEMOGLOBIN A1C: 5.4 % (ref 4.8–5.6)

## 2017-12-15 LAB — LIPID PANEL
CHOL/HDL RATIO: 3 ratio (ref 0.0–4.4)
Cholesterol, Total: 207 mg/dL — ABNORMAL HIGH (ref 100–199)
HDL: 70 mg/dL (ref >39)
LDL Calculated: 115 mg/dL — ABNORMAL HIGH (ref 0–99)
Triglycerides: 110 mg/dL (ref 0–149)
VLDL Cholesterol Cal: 22 mg/dL (ref 5–40)

## 2017-12-15 LAB — TSH: TSH: 1.75 u[IU]/mL (ref 0.450–4.500)

## 2017-12-17 ENCOUNTER — Other Ambulatory Visit: Payer: Self-pay | Admitting: Obstetrics and Gynecology

## 2017-12-17 DIAGNOSIS — R921 Mammographic calcification found on diagnostic imaging of breast: Secondary | ICD-10-CM

## 2017-12-22 ENCOUNTER — Other Ambulatory Visit: Payer: Self-pay | Admitting: Obstetrics and Gynecology

## 2017-12-22 ENCOUNTER — Ambulatory Visit
Admission: RE | Admit: 2017-12-22 | Discharge: 2017-12-22 | Disposition: A | Discharge status: Home / Self Care | Payer: BLUE CROSS/BLUE SHIELD | Source: Ambulatory Visit | Attending: Obstetrics and Gynecology | Admitting: Obstetrics and Gynecology

## 2017-12-22 DIAGNOSIS — R921 Mammographic calcification found on diagnostic imaging of breast: Secondary | ICD-10-CM

## 2018-06-27 ENCOUNTER — Other Ambulatory Visit: Payer: Self-pay | Admitting: Obstetrics and Gynecology

## 2018-06-27 ENCOUNTER — Ambulatory Visit
Admission: RE | Admit: 2018-06-27 | Discharge: 2018-06-27 | Disposition: A | Discharge status: Home / Self Care | Payer: BLUE CROSS/BLUE SHIELD | Source: Ambulatory Visit | Attending: Obstetrics and Gynecology | Admitting: Obstetrics and Gynecology

## 2018-06-27 DIAGNOSIS — R921 Mammographic calcification found on diagnostic imaging of breast: Secondary | ICD-10-CM | POA: Diagnosis not present

## 2018-10-17 ENCOUNTER — Telehealth: Payer: Self-pay

## 2018-10-17 NOTE — Telephone Encounter (Signed)
She needs to f/u with her PCP for this.

## 2018-10-17 NOTE — Telephone Encounter (Signed)
Pt called stating that she has gained 11 pounds and wants to use Saxenda as well

## 2018-10-18 NOTE — Telephone Encounter (Signed)
Pt came into office wanting sample. Advised to contact pcp per JG. Pt states that she does not have pcp only see's JG. Verbally discussed with AK and advised pt that she needed to find a pcp or schedule appt since she hasnt been seen since 2018. Pt scheduled appt.//ah

## 2018-10-20 NOTE — Progress Notes (Signed)
Primary Physician/Referring:  No primary care provider on file.  Patient ID: Brittany Kelly, female    DOB: 06-Jul-1970, 48 y.o.   MRN: [id number]  Chief Complaint  Patient presents with  . Weight Gain    pt c/o 10 pound weight gain     HPI: Brittany Kelly  is a 48 y.o. female  with obesity last seen in 2018 for weight management. 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 No history of hypertension or hyperlipidemia. Patient was started on Belviq in March 2017 and discontinued in December 2017 due to insurance reasons and was switched to Contrave.  She was on the weight loss medication when I last saw her in July 2018 that lost 25 pounds in weight.  No cardiac testing was performed, her lipids were normal and metabolic profile otherwise was unremarkable.  She has gained her weight back and presents to discuss options. Plays tennis everyday and keeps active.  Past Medical History:  Diagnosis Date  . Allergic rhinitis   . PONV (postoperative nausea and vomiting)     Past Surgical History:  Procedure Laterality Date  . CESAREAN SECTION  1996  . CHOLECYSTECTOMY  10/04/2013  . CYST EXCISION Bilateral 1995   under both arms  . CYSTOSCOPY N/A 11/21/2013   Procedure: CYSTOSCOPY;  Surgeon: Jamey Reas de Berton Lan, MD;  Location: [facility];  Service: Gynecology;  Laterality: N/A;  . ROBOTIC ASSISTED TOTAL HYSTERECTOMY WITH BILATERAL SALPINGO OOPHERECTOMY Bilateral 11/21/2013   Procedure: ROBOTIC ASSISTED TOTAL HYSTERECTOMY WITH BILATERAL SALPINGO OOPHORECTOMY;  Surgeon: Jamey Reas de Berton Lan, MD;  Location: [facility];  Service: Gynecology;  Laterality: Bilateral;    Social History   Socioeconomic History  . Marital status: Married    Spouse name: Not on file  . Number of children: Not on file  . Years of education: Not on file  . Highest education level: Not on file  Occupational History  . Not on file  Social Needs  . Financial resource strain: Not on  file  . Food insecurity:    Worry: Not on file    Inability: Not on file  . Transportation needs:    Medical: Not on file    Non-medical: Not on file  Tobacco Use  . Smoking status: Never Smoker  . Smokeless tobacco: Never Used  Substance and Sexual Activity  . Alcohol use: Yes    Alcohol/week: 4.0 standard drinks    Types: 4 Standard drinks or equivalent per week  . Drug use: No  . Sexual activity: Yes    Partners: Male    Birth control/protection: Surgical    Comment: R-TAH/BSO  Lifestyle  . Physical activity:    Days per week: Not on file    Minutes per session: Not on file  . Stress: Not on file  Relationships  . Social connections:    Talks on phone: Not on file    Gets together: Not on file    Attends religious service: Not on file    Active member of club or organization: Not on file    Attends meetings of clubs or organizations: Not on file    Relationship status: Not on file  . Intimate partner violence:    Fear of current or ex partner: Not on file    Emotionally abused: Not on file    Physically abused: Not on file    Forced sexual activity: Not on file  Other Topics Concern  . Not on  file  Social History Narrative   Married to [employer] 1 child.    Some college. Youth worker.   Drinks caffeine.    Wears her seatbelt.   Exercises > 3x week.    Guns in the home in a locked cabinet.    Feels safe in her relationships.    Current Outpatient Medications on File Prior to Visit  Medication Sig Dispense Refill  . conjugated estrogens (PREMARIN) vaginal cream USE 1/2 GRAM VAGINALLY TWICE PER WEEK AT BEDTIME 30 g 2  . estradiol (MINIVELLE) 0.05 MG/24HR patch Place 1 patch (0.05 mg total) onto the skin 2 (two) times a week. 24 patch 3  . Liraglutide -Weight Management (SAXENDA) 18 MG/3ML SOPN Inject 0.6 application into the skin. Follow instructions     No current facility-administered medications on file prior to visit.    Review of Systems   Constitution: Negative for chills, decreased appetite, malaise/fatigue and weight gain.  Cardiovascular: Negative for dyspnea on exertion, leg swelling and syncope.  Endocrine: Negative for cold intolerance.  Hematologic/Lymphatic: Does not bruise/bleed easily.  Musculoskeletal: Negative for joint swelling.  Gastrointestinal: Negative for abdominal pain, anorexia, change in bowel habit, hematochezia and melena.  Neurological: Negative for headaches and light-headedness.  Psychiatric/Behavioral: Negative for depression and substance abuse.  All other systems reviewed and are negative.     Objective  Blood pressure 117/81, pulse 70, temperature 98.4 F (36.9 C), height '5\' 3"'  (1.6 m), weight 184 lb (83.5 kg), last menstrual period 10/14/2013, SpO2 97 %. Body mass index is 32.59 kg/m.    Physical Exam  Constitutional: She appears well-developed. No distress.  Mildly obese  HENT:  Head: Atraumatic.  Eyes: Conjunctivae are normal.  Neck: Neck supple. No JVD present. No thyromegaly present.  Cardiovascular: Normal rate, regular rhythm, normal heart sounds and intact distal pulses. Exam reveals no gallop.  No murmur heard. Pulmonary/Chest: Effort normal and breath sounds normal.  Abdominal: Soft. Bowel sounds are normal.  Musculoskeletal: Normal range of motion.  Neurological: She is alert.  Skin: Skin is warm and dry.  Psychiatric: She has a normal mood and affect.   Radiology: No results found.,  Laboratory examination:   CMP Latest Ref Rng & Units 01/28/2017 11/11/2016 10/03/2015  Glucose 70 - 99 mg/dL 105(H) 99 -  BUN 6 - 23 mg/dL 17 15 -  Creatinine 0.40 - 1.20 mg/dL 0.65 0.78 -  Sodium 135 - 145 mEq/L 140 141 -  Potassium 3.5 - 5.1 mEq/L 4.4 4.8 -  Chloride 96 - 112 mEq/L 102 101 -  CO2 19 - 32 mEq/L 28 25 -  Calcium 8.4 - 10.5 mg/dL 9.6 9.7 -  Total Protein 6.0 - 8.5 g/dL - 7.6 7.9  Total Bilirubin 0.0 - 1.2 mg/dL - 0.9 1.2  Alkaline Phos 39 - 117 IU/L - 99 154(H)  AST 0  - 40 IU/L - 22 35  ALT 0 - 32 IU/L - 14 44(H)   CBC Latest Ref Rng & Units 12/14/2017 01/27/2017 10/03/2015  WBC 3.4 - 10.8 x10E3/uL 7.1 6.9 6.4  Hemoglobin 11.1 - 15.9 g/dL 13.3 13.0 12.8  Hematocrit 34.0 - 46.6 % 40.9 36.9 38.1  Platelets 150 - 450 x10E3/uL 379 333 518(H)   Lipid Panel     Component Value Date/Time   CHOL 207 (H) 12/14/2017 1325   TRIG 110 12/14/2017 1325   HDL 70 12/14/2017 1325   CHOLHDL 3.0 12/14/2017 1325   LDLCALC 115 (H) 12/14/2017 1325   NHDL 127  HEMOGLOBIN A1C Lab Results  Component Value Date   HGBA1C 5.4 12/14/2017   TSH Recent Labs    12/14/17 1325  TSH 1.750   Cardiac Studies:   None  Assessment   Weight gain - Plan: EKG 12-Lead  Mild obesity - Plan: Liraglutide -Weight Management (SAXENDA) 18 MG/3ML SOPN, NEEDLE, DISP, 23 G 23G X 1" MISC, CMP14+EGFR   EKG 10/21/2018: Normal sinus rhythm at rate of 69 bpm.  No evidence of ischemia, normal EKG.  Recommendations:   Patient with mild obesity and no other CV risks, wants to try Saxenda. I have reviewed low calorie diet, continue daily exercise. Side effects discussed, nausea, pancreatitis discussed. I will see  Her back in 10 weeks for f/u.   Adrian Prows, MD, [facility] 10/22/2018, 8:32 AM [facility] [place] Pager: [phone] Office: [phone] If no answer Cell [phone]

## 2018-10-21 ENCOUNTER — Ambulatory Visit: Payer: BC Managed Care – PPO | Admitting: Cardiology

## 2018-10-21 ENCOUNTER — Encounter: Payer: Self-pay | Admitting: Cardiology

## 2018-10-21 ENCOUNTER — Other Ambulatory Visit: Payer: Self-pay

## 2018-10-21 VITALS — BP 117/81 | HR 70 | Temp 98.4°F | Ht 63.0 in | Wt 184.0 lb

## 2018-10-21 DIAGNOSIS — R635 Abnormal weight gain: Secondary | ICD-10-CM

## 2018-10-21 DIAGNOSIS — E669 Obesity, unspecified: Secondary | ICD-10-CM

## 2018-10-22 ENCOUNTER — Encounter: Payer: Self-pay | Admitting: Cardiology

## 2018-10-22 MED ORDER — LIRAGLUTIDE -WEIGHT MANAGEMENT 18 MG/3ML ~~LOC~~ SOPN: 3.0000 mg | PEN_INJECTOR | Freq: Every day | SUBCUTANEOUS | 3 refills | Status: DC

## 2018-10-22 MED ORDER — "NEEDLE (DISP) 23G X 1"" MISC": 1.0000 [IU] | Freq: Every day | 3 refills | Status: DC

## 2018-10-22 NOTE — Addendum Note (Signed)
Addended by: Kela Millin on: 10/22/2018 08:45 AM   Modules accepted: Orders

## 2018-11-08 ENCOUNTER — Other Ambulatory Visit: Payer: Self-pay

## 2018-11-21 ENCOUNTER — Ambulatory Visit (INDEPENDENT_AMBULATORY_CARE_PROVIDER_SITE_OTHER): Payer: BC Managed Care – PPO | Admitting: Cardiology

## 2018-11-21 ENCOUNTER — Encounter: Payer: Self-pay | Admitting: Cardiology

## 2018-11-21 ENCOUNTER — Other Ambulatory Visit: Payer: Self-pay

## 2018-11-21 VITALS — Ht 63.0 in | Wt 185.0 lb

## 2018-11-21 DIAGNOSIS — E669 Obesity, unspecified: Secondary | ICD-10-CM | POA: Diagnosis not present

## 2018-11-21 MED ORDER — PHENTERMINE HCL 15 MG PO CAPS: 15.0000 mg | ORAL_CAPSULE | Freq: Two times a day (BID) | ORAL | 2 refills | Status: DC

## 2018-11-21 NOTE — Progress Notes (Signed)
Primary Physician/Referring:  Patient, No Pcp Per  Patient ID: Brittany Kelly, female    DOB: 1971/01/20, 48 y.o.   MRN: [id number]  Chief Complaint  Patient presents with  . medication weight mgmt    HPI: Brittany Kelly  is a 48 y.o. female  with obesity last seen in 2018 for weight management. 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 No history of hypertension or hyperlipidemia. Patient was started on Belviq in March 2017 and discontinued in December 2017 due to insurance reasons and was switched to Contrave. She lost 25 pounds in weight.  No cardiac testing was performed, her lipids were normal and metabolic profile otherwise was unremarkable. She is very active and plays tennis at least 4-5 days a week.   She has gained her weight back and  On her last visit 6 weeks ago I had started her on Saxenda which she finds it very expensive. She wants to change medications.   Past Medical History:  Diagnosis Date  . Allergic rhinitis   . PONV (postoperative nausea and vomiting)     Past Surgical History:  Procedure Laterality Date  . CESAREAN SECTION  1996  . CHOLECYSTECTOMY  10/04/2013  . CYST EXCISION Bilateral 1995   under both arms  . CYSTOSCOPY N/A 11/21/2013   Procedure: CYSTOSCOPY;  Surgeon: Jamey Reas de Berton Lan, MD;  Location: [facility];  Service: Gynecology;  Laterality: N/A;  . ROBOTIC ASSISTED TOTAL HYSTERECTOMY WITH BILATERAL SALPINGO OOPHERECTOMY Bilateral 11/21/2013   Procedure: ROBOTIC ASSISTED TOTAL HYSTERECTOMY WITH BILATERAL SALPINGO OOPHORECTOMY;  Surgeon: Jamey Reas de Berton Lan, MD;  Location: [facility];  Service: Gynecology;  Laterality: Bilateral;    Social History   Socioeconomic History  . Marital status: Married    Spouse name: Not on file  . Number of children: Not on file  . Years of education: Not on file  . Highest education level: Not on file  Occupational History  . Not on file  Social Needs  . Financial resource  strain: Not on file  . Food insecurity    Worry: Not on file    Inability: Not on file  . Transportation needs    Medical: Not on file    Non-medical: Not on file  Tobacco Use  . Smoking status: Never Smoker  . Smokeless tobacco: Never Used  Substance and Sexual Activity  . Alcohol use: Yes    Alcohol/week: 4.0 standard drinks    Types: 4 Standard drinks or equivalent per week  . Drug use: No  . Sexual activity: Yes    Partners: Male    Birth control/protection: Surgical    Comment: R-TAH/BSO  Lifestyle  . Physical activity    Days per week: Not on file    Minutes per session: Not on file  . Stress: Not on file  Relationships  . Social Herbalist on phone: Not on file    Gets together: Not on file    Attends religious service: Not on file    Active member of club or organization: Not on file    Attends meetings of clubs or organizations: Not on file    Relationship status: Not on file  . Intimate partner violence    Fear of current or ex partner: Not on file    Emotionally abused: Not on file    Physically abused: Not on file    Forced sexual activity: Not on file  Other Topics Concern  .  Not on file  Social History Narrative   Married to [employer] 1 child.    Some college. Youth worker.   Drinks caffeine.    Wears her seatbelt.   Exercises > 3x week.    Guns in the home in a locked cabinet.    Feels safe in her relationships.    Current Outpatient Medications on File Prior to Visit  Medication Sig Dispense Refill  . conjugated estrogens (PREMARIN) vaginal cream USE 1/2 GRAM VAGINALLY TWICE PER WEEK AT BEDTIME 30 g 2  . estradiol (MINIVELLE) 0.05 MG/24HR patch Place 1 patch (0.05 mg total) onto the skin 2 (two) times a week. 24 patch 3  . Liraglutide -Weight Management (SAXENDA) 18 MG/3ML SOPN Inject 0.6 application into the skin. Follow instructions    . Liraglutide -Weight Management (SAXENDA) 18 MG/3ML SOPN Inject 3 mg into the skin  daily. Take as directed (Patient not taking: Reported on 11/21/2018) 5 pen 3  . NEEDLE, DISP, 23 G 23G X 1" MISC 1 Units by Does not apply route daily. (Patient not taking: Reported on 11/21/2018) 2 each 3   No current facility-administered medications on file prior to visit.    Review of Systems  Constitution: Negative for chills, decreased appetite, malaise/fatigue and weight gain.  Cardiovascular: Negative for dyspnea on exertion, leg swelling and syncope.  Endocrine: Negative for cold intolerance.  Hematologic/Lymphatic: Does not bruise/bleed easily.  Musculoskeletal: Negative for joint swelling.  Gastrointestinal: Negative for abdominal pain, anorexia, change in bowel habit, hematochezia and melena.  Neurological: Negative for headaches and light-headedness.  Psychiatric/Behavioral: Negative for depression and substance abuse.  All other systems reviewed and are negative.     Objective  Height 5\' 3"  (1.6 m), weight 185 lb (83.9 kg), last menstrual period 10/14/2013. Body mass index is 32.77 kg/m.    Physical Exam  Constitutional: She appears well-developed. No distress.  Mildly obese  HENT:  Head: Atraumatic.  Eyes: Conjunctivae are normal.  Neck: Neck supple. No JVD present. No thyromegaly present.  Cardiovascular: Normal rate, regular rhythm, normal heart sounds and intact distal pulses. Exam reveals no gallop.  No murmur heard. Pulmonary/Chest: Effort normal and breath sounds normal.  Abdominal: Soft. Bowel sounds are normal.  Musculoskeletal: Normal range of motion.  Neurological: She is alert.  Skin: Skin is warm and dry.  Psychiatric: She has a normal mood and affect.   Radiology: No results found.,  Laboratory examination:   CMP Latest Ref Rng & Units 01/28/2017 11/11/2016 10/03/2015  Glucose 70 - 99 mg/dL 105(H) 99 -  BUN 6 - 23 mg/dL 17 15 -  Creatinine 0.40 - 1.20 mg/dL 0.65 0.78 -  Sodium 135 - 145 mEq/L 140 141 -  Potassium 3.5 - 5.1 mEq/L 4.4 4.8 -  Chloride  96 - 112 mEq/L 102 101 -  CO2 19 - 32 mEq/L 28 25 -  Calcium 8.4 - 10.5 mg/dL 9.6 9.7 -  Total Protein 6.0 - 8.5 g/dL - 7.6 7.9  Total Bilirubin 0.0 - 1.2 mg/dL - 0.9 1.2  Alkaline Phos 39 - 117 IU/L - 99 154(H)  AST 0 - 40 IU/L - 22 35  ALT 0 - 32 IU/L - 14 44(H)   CBC Latest Ref Rng & Units 12/14/2017 01/27/2017 10/03/2015  WBC 3.4 - 10.8 x10E3/uL 7.1 6.9 6.4  Hemoglobin 11.1 - 15.9 g/dL 13.3 13.0 12.8  Hematocrit 34.0 - 46.6 % 40.9 36.9 38.1  Platelets 150 - 450 x10E3/uL 379 333 518(H)   Lipid Panel  Component Value Date/Time   CHOL 207 (H) 12/14/2017 1325   TRIG 110 12/14/2017 1325   HDL 70 12/14/2017 1325   CHOLHDL 3.0 12/14/2017 1325   LDLCALC 115 (H) 12/14/2017 1325   NHDL 127  HEMOGLOBIN A1C Lab Results  Component Value Date   HGBA1C 5.4 12/14/2017   TSH Recent Labs    12/14/17 1325  TSH 1.750   Cardiac Studies:   None  Assessment   Mild obesity - Plan: phentermine 15 MG capsule BID   EKG 10/21/2018: Normal sinus rhythm at rate of 69 bpm.  No evidence of ischemia, normal EKG.  Recommendations:   Patient with mild obesity and no other CV risks, she tried [place] but would like to discontinue due to expense. I have reviewed low calorie diet, continue daily exercise. Will try Phenteramine. Side effects discussed, husband present. I will see  Her back in 4 weeks for f/u.   Adrian Prows, MD, [facility] 11/21/2018, 10:21 AM [facility] [place] Pager: [phone] Office: [phone] If no answer Cell [phone]

## 2018-12-21 NOTE — Progress Notes (Signed)
48 y.o. G24P1001 Married Caucasian female here for annual exam.    Patient complains Estrogen patches will not stay on. She feels really good on this, and would like to continue.   She feels like she is retaining fluid in her abdomen when she plays tennis.   PCP:  Howard Pouch, DO   Patient's last menstrual period was 10/14/2013.           Sexually active: Yes.    The current method of family planning is status post hysterectomy.    Exercising: Yes.    tennis Smoker:  no  Health Maintenance: Pap: 10-18-13 Neg:Neg HR HPV History of abnormal Pap:  no MMG: 06-27-17 Rt.Diag. stable benign Rt.calcifications/density c/Bil.Diag.6 months/Birads3--has appt. 12-28-18 Colonoscopy:  n/a BMD: 12-21-16  Result :Normal TDaP: Unsure.  Received when moved here over 10 year ago.  Gardasil:   no HIV: 2010 Neg per patient Hep C: 10-03-15 Neg Screening Labs:   PCP.   reports that she has never smoked. She has never used smokeless tobacco. She reports current alcohol use of about 2.0 standard drinks of alcohol per week. She reports that she does not use drugs.  Past Medical History:  Diagnosis Date  . Allergic rhinitis   . PONV (postoperative nausea and vomiting)     Past Surgical History:  Procedure Laterality Date  . CESAREAN SECTION  1996  . CHOLECYSTECTOMY  10/04/2013  . CYST EXCISION Bilateral 1995   under both arms  . CYSTOSCOPY N/A 11/21/2013   Procedure: CYSTOSCOPY;  Surgeon: Jamey Reas de Berton Lan, MD;  Location: [facility];  Service: Gynecology;  Laterality: N/A;  . ROBOTIC ASSISTED TOTAL HYSTERECTOMY WITH BILATERAL SALPINGO OOPHERECTOMY Bilateral 11/21/2013   Procedure: ROBOTIC ASSISTED TOTAL HYSTERECTOMY WITH BILATERAL SALPINGO OOPHORECTOMY;  Surgeon: Jamey Reas de Berton Lan, MD;  Location: [facility];  Service: Gynecology;  Laterality: Bilateral;    Current Outpatient Medications  Medication Sig Dispense Refill  . conjugated estrogens (PREMARIN) vaginal cream USE 1/2  GRAM VAGINALLY TWICE PER WEEK AT BEDTIME 30 g 2  . estradiol (MINIVELLE) 0.05 MG/24HR patch Place 1 patch (0.05 mg total) onto the skin 2 (two) times a week. 24 patch 3  . Liraglutide -Weight Management (SAXENDA) 18 MG/3ML SOPN Inject 0.6 application into the skin. Follow instructions    . Liraglutide -Weight Management (SAXENDA) 18 MG/3ML SOPN Inject 3 mg into the skin daily. Take as directed (Patient not taking: Reported on 11/21/2018) 5 pen 3  . NEEDLE, DISP, 23 G 23G X 1" MISC 1 Units by Does not apply route daily. (Patient not taking: Reported on 11/21/2018) 2 each 3  . phentermine 15 MG capsule Take 1 capsule (15 mg total) by mouth 2 (two) times a day. 7 AM and 12 noon 60 capsule 2   No current facility-administered medications for this visit.     Family History  Problem Relation Age of Onset  . Diabetes Mother   . Hypertension Mother   . Arthritis Mother   . Hypertension Father   . Arthritis Father   . Breast cancer Neg Hx     Review of Systems  Gastrointestinal: Positive for abdominal distention.       Abdominal bloating.  All other systems reviewed and are negative.   Exam:   BP 132/84   Pulse 70   Temp 97.7 F (36.5 C)   Ht 5\' 3"  (1.6 m)   Wt 181 lb (82.1 kg)   LMP 10/14/2013   BMI 32.06  kg/m     General appearance: alert, cooperative and appears stated age Head: normocephalic, without obvious abnormality, atraumatic Neck: no adenopathy, supple, symmetrical, trachea midline and thyroid normal to inspection and palpation Lungs: clear to auscultation bilaterally Breasts: normal appearance, no masses or tenderness, No nipple retraction or dimpling, No nipple discharge or bleeding, No axillary adenopathy Heart: regular rate and rhythm Abdomen: soft, non-tender; no masses, no organomegaly Extremities: extremities normal, atraumatic, no cyanosis or edema Skin: skin color, texture, turgor normal. No rashes or lesions Lymph nodes: cervical, supraclavicular, and axillary  nodes normal. Neurologic: grossly normal  Pelvic: External genitalia:  no lesions              No abnormal inguinal nodes palpated.              Urethra:  normal appearing urethra with no masses, tenderness or lesions              Bartholins and Skenes: normal                 Vagina: normal appearing vagina with normal color and discharge, no lesions              Cervix:  absent              Pap taken: No. Bimanual Exam:  Uterus:   absent              Adnexa: no mass, fullness, tenderness              Rectal exam: Yes.  .  Confirms.              Anus:  normal sphincter tone, no lesions  Chaperone was present for exam.  Assessment:   Well woman visit with normal exam. Status post robotic hysterectomy with BSO. Estrogen therapy.  Right breast calcifications.  FH osteoporosis.   Plan: Bilateral dx mammogram scheduled.  Self breast awareness reviewed. Pap and HR HPV as above. Guidelines for Calcium, Vitamin D, regular exercise program including cardiovascular and weight bearing exercise. Refill of Vivelle Dot transdermal estrogen and Premarin vaginal cream. Coupon given for the Premarin cream. Discused WHI and use of ERT which can increase risk of PE, DVT, and stroke. Colonoscopy next year.  Routine labs with PCP. Follow up annually and prn.   After visit summary provided.

## 2018-12-22 ENCOUNTER — Encounter: Payer: Self-pay | Admitting: Obstetrics and Gynecology

## 2018-12-22 ENCOUNTER — Ambulatory Visit: Payer: BC Managed Care – PPO | Admitting: Obstetrics and Gynecology

## 2018-12-22 ENCOUNTER — Other Ambulatory Visit: Payer: Self-pay

## 2018-12-22 VITALS — BP 132/84 | HR 70 | Temp 97.7°F | Ht 63.0 in | Wt 181.0 lb

## 2018-12-22 DIAGNOSIS — Z01419 Encounter for gynecological examination (general) (routine) without abnormal findings: Secondary | ICD-10-CM

## 2018-12-22 MED ORDER — ESTRADIOL 0.05 MG/24HR TD PTTW: 1.0000 | MEDICATED_PATCH | TRANSDERMAL | 3 refills | Status: DC

## 2018-12-22 MED ORDER — PREMARIN 0.625 MG/GM VA CREA: TOPICAL_CREAM | VAGINAL | 2 refills | Status: DC

## 2018-12-22 NOTE — Patient Instructions (Signed)

## 2018-12-28 ENCOUNTER — Other Ambulatory Visit: Payer: Self-pay

## 2018-12-28 ENCOUNTER — Ambulatory Visit
Admission: RE | Admit: 2018-12-28 | Discharge: 2018-12-28 | Disposition: A | Discharge status: Home / Self Care | Payer: BC Managed Care – PPO | Source: Ambulatory Visit | Attending: Obstetrics and Gynecology | Admitting: Obstetrics and Gynecology

## 2018-12-28 ENCOUNTER — Ambulatory Visit (INDEPENDENT_AMBULATORY_CARE_PROVIDER_SITE_OTHER): Payer: BC Managed Care – PPO

## 2018-12-28 VITALS — BP 122/74 | HR 64 | Temp 97.4°F | Resp 14 | Ht 63.0 in | Wt 181.0 lb

## 2018-12-28 DIAGNOSIS — Z23 Encounter for immunization: Secondary | ICD-10-CM | POA: Diagnosis not present

## 2018-12-28 DIAGNOSIS — R922 Inconclusive mammogram: Secondary | ICD-10-CM | POA: Diagnosis not present

## 2018-12-28 DIAGNOSIS — R921 Mammographic calcification found on diagnostic imaging of breast: Secondary | ICD-10-CM

## 2018-12-28 NOTE — Progress Notes (Signed)
Patient here for T-dap Immunization. She states that she has a mild fever the last time she received this vaccine.  Vaccine given in Left Deltoid. Patient tolerated it well.

## 2019-01-02 ENCOUNTER — Ambulatory Visit: Payer: BC Managed Care – PPO | Admitting: Cardiology

## 2019-01-27 ENCOUNTER — Ambulatory Visit: Payer: BC Managed Care – PPO | Admitting: Cardiology

## 2019-06-14 ENCOUNTER — Ambulatory Visit: Payer: BC Managed Care – PPO | Admitting: Cardiology

## 2019-08-23 ENCOUNTER — Ambulatory Visit: Payer: BC Managed Care – PPO | Admitting: Cardiology

## 2019-09-04 ENCOUNTER — Encounter: Payer: Self-pay | Admitting: Cardiology

## 2019-09-04 ENCOUNTER — Ambulatory Visit: Payer: BC Managed Care – PPO | Admitting: Cardiology

## 2019-09-04 ENCOUNTER — Other Ambulatory Visit: Payer: Self-pay

## 2019-09-04 VITALS — BP 129/85 | HR 80 | Temp 97.4°F | Resp 16 | Ht 63.0 in | Wt 186.0 lb

## 2019-09-04 DIAGNOSIS — E669 Obesity, unspecified: Secondary | ICD-10-CM

## 2019-09-04 DIAGNOSIS — R635 Abnormal weight gain: Secondary | ICD-10-CM

## 2019-09-04 MED ORDER — BUPROPION HCL ER (SR) 100 MG PO TB12: 100.0000 mg | ORAL_TABLET | Freq: Two times a day (BID) | ORAL | 4 refills | Status: DC

## 2019-09-04 NOTE — Progress Notes (Signed)
Primary Physician/Referring:  Patient, No Pcp Per  Patient ID: Brittany Kelly, female    DOB: 02-Dec-1970, 49 y.o.   MRN: [id number]  Chief Complaint  Patient presents with  . Follow-up    3 month  . weight managment    HPI: Brittany Kelly  is a 49 y.o. female  with obesity last seen in 2018 for weight management. 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 No history of hypertension or hyperlipidemia. Patient was started on Belviq in March 2017 and discontinued in December 2017 due to insurance reasons and was switched to Contrave. She lost 25 pounds in weight.  No cardiac testing was performed, her lipids were normal and metabolic profile otherwise was unremarkable. She is very active and plays tennis at least 4-5 days a week.   She has gained her weight back and is wondering if there is any other medication she can try.  Because of her schedule she has not been able to cook, they have been eating out almost on a daily basis as they have been working almost 6 to 7 days a week.  Past Medical History:  Diagnosis Date  . Allergic rhinitis   . PONV (postoperative nausea and vomiting)     Past Surgical History:  Procedure Laterality Date  . CESAREAN SECTION  1996  . CHOLECYSTECTOMY  10/04/2013  . CYST EXCISION Bilateral 1995   under both arms  . CYSTOSCOPY N/A 11/21/2013   Procedure: CYSTOSCOPY;  Surgeon: Jamey Reas de Berton Lan, MD;  Location: [facility];  Service: Gynecology;  Laterality: N/A;  . ROBOTIC ASSISTED TOTAL HYSTERECTOMY WITH BILATERAL SALPINGO OOPHERECTOMY Bilateral 11/21/2013   Procedure: ROBOTIC ASSISTED TOTAL HYSTERECTOMY WITH BILATERAL SALPINGO OOPHORECTOMY;  Surgeon: Jamey Reas de Berton Lan, MD;  Location: [facility];  Service: Gynecology;  Laterality: Bilateral;   Social History   Tobacco Use  . Smoking status: Never Smoker  . Smokeless tobacco: Never Used  Substance Use Topics  . Alcohol use: Yes    Alcohol/week: 2.0 standard drinks     Types: 2 Standard drinks or equivalent per week   Marital Status: Married   Current Outpatient Medications on File Prior to Visit  Medication Sig Dispense Refill  . conjugated estrogens (PREMARIN) vaginal cream USE 1/2 GRAM VAGINALLY TWICE PER WEEK AT BEDTIME 30 g 2  . estradiol (MINIVELLE) 0.05 MG/24HR patch Place 1 patch (0.05 mg total) onto the skin 2 (two) times a week. 24 patch 3   No current facility-administered medications on file prior to visit.   Review of Systems  Cardiovascular: Negative for chest pain, dyspnea on exertion and leg swelling.  Gastrointestinal: Negative for melena.   Objective  Blood pressure 129/85, pulse 80, temperature (!) 97.4 F (36.3 C), temperature source Temporal, resp. rate 16, height 5' 3" (1.6 m), weight 186 lb (84.4 kg), last menstrual period 10/14/2013, SpO2 98 %. Body mass index is 32.95 kg/m.  Vitals with BMI 09/04/2019 12/28/2018 12/22/2018  Height 5' 3" 5' 3" 5' 3"  Weight 186 lbs 181 lbs 181 lbs  BMI 32.96 94.76 54.65  Systolic 035 465 681  Diastolic 85 74 84  Pulse 80 64 70      Physical Exam  Cardiovascular: Normal rate, regular rhythm, normal heart sounds and intact distal pulses. Exam reveals no gallop.  No murmur heard. No leg edema, no JVD.  Pulmonary/Chest: Effort normal and breath sounds normal.  Abdominal: Soft. Bowel sounds are normal.   Radiology: No results found.,  Laboratory examination:   CMP Latest Ref Rng & Units 01/28/2017 11/11/2016 10/03/2015  Glucose 70 - 99 mg/dL 105(H) 99 -  BUN 6 - 23 mg/dL 17 15 -  Creatinine 0.40 - 1.20 mg/dL 0.65 0.78 -  Sodium 135 - 145 mEq/L 140 141 -  Potassium 3.5 - 5.1 mEq/L 4.4 4.8 -  Chloride 96 - 112 mEq/L 102 101 -  CO2 19 - 32 mEq/L 28 25 -  Calcium 8.4 - 10.5 mg/dL 9.6 9.7 -  Total Protein 6.0 - 8.5 g/dL - 7.6 7.9  Total Bilirubin 0.0 - 1.2 mg/dL - 0.9 1.2  Alkaline Phos 39 - 117 IU/L - 99 154(H)  AST 0 - 40 IU/L - 22 35  ALT 0 - 32 IU/L - 14 44(H)   CBC Latest Ref Rng &  Units 12/14/2017 01/27/2017 10/03/2015  WBC 3.4 - 10.8 x10E3/uL 7.1 6.9 6.4  Hemoglobin 11.1 - 15.9 g/dL 13.3 13.0 12.8  Hematocrit 34.0 - 46.6 % 40.9 36.9 38.1  Platelets 150 - 450 x10E3/uL 379 333 518(H)   Lipid Panel     Component Value Date/Time   CHOL 207 (H) 12/14/2017 1325   TRIG 110 12/14/2017 1325   HDL 70 12/14/2017 1325   CHOLHDL 3.0 12/14/2017 1325   LDLCALC 115 (H) 12/14/2017 1325   NHDL 127  HEMOGLOBIN A1C Lab Results  Component Value Date   HGBA1C 5.4 12/14/2017   TSH No results for input(s): TSH in the last 8760 hours. Cardiac Studies:   EKG 09/04/2019: Normal sinus rhythm at rate of 78 bpm, normal axis.  No evident ischemia, normal EKG.    Assessment     ICD-10-CM   1. Weight gain  R63.5 buPROPion (WELLBUTRIN SR) 100 MG 12 hr tablet    CMP14+EGFR    TSH  2. Mild obesity  E66.9 EKG 12-Lead    buPROPion (WELLBUTRIN SR) 100 MG 12 hr tablet    CBC    Recommendations:    Brittany Kelly  is a 49 y.o. female  with obesity last seen in 2018 for weight management,  with mild obesity and no other CV risks, she did try phentermine as well.  But due to long-term side effects, I did not continue the medication.  She is willing to try Wellbutrin which I prescribed.  If it works, she can follow-up with PCP and can be on it long-term without on the side effects.  She will start with 1 pill once a day and then increase to twice daily after a week.  I will see her back on a as needed basis.  25-minute office visit discussing weight loss. I ordered lipids, TSH and CBC.   Adrian Prows, MD, [facility] 09/04/2019, 3:00 PM [facility] Office: [phone]   CC: Janne Lab, MD (Gyn)

## 2019-09-20 DIAGNOSIS — Z20822 Contact with and (suspected) exposure to covid-19: Secondary | ICD-10-CM | POA: Diagnosis not present

## 2019-09-27 ENCOUNTER — Other Ambulatory Visit: Payer: Self-pay | Admitting: Cardiology

## 2019-09-27 DIAGNOSIS — E669 Obesity, unspecified: Secondary | ICD-10-CM

## 2019-09-27 DIAGNOSIS — R635 Abnormal weight gain: Secondary | ICD-10-CM

## 2019-11-03 DIAGNOSIS — E669 Obesity, unspecified: Secondary | ICD-10-CM | POA: Diagnosis not present

## 2019-11-03 DIAGNOSIS — R635 Abnormal weight gain: Secondary | ICD-10-CM | POA: Diagnosis not present

## 2019-11-04 LAB — CMP14+EGFR
ALT: 13 IU/L (ref 0–32)
AST: 19 IU/L (ref 0–40)
Albumin/Globulin Ratio: 1.5 (ref 1.2–2.2)
Albumin: 4.5 g/dL (ref 3.8–4.8)
Alkaline Phosphatase: 113 IU/L (ref 48–121)
BUN/Creatinine Ratio: 21 (ref 9–23)
BUN: 16 mg/dL (ref 6–24)
Bilirubin Total: 0.9 mg/dL (ref 0.0–1.2)
CO2: 24 mmol/L (ref 20–29)
Calcium: 10 mg/dL (ref 8.7–10.2)
Chloride: 103 mmol/L (ref 96–106)
Creatinine, Ser: 0.77 mg/dL (ref 0.57–1.00)
GFR calc Af Amer: 104 mL/min/{1.73_m2} (ref >59)
GFR calc non Af Amer: 90 mL/min/{1.73_m2} (ref >59)
Globulin, Total: 3.1 g/dL (ref 1.5–4.5)
Glucose: 89 mg/dL (ref 65–99)
Potassium: 5.2 mmol/L (ref 3.5–5.2)
Sodium: 142 mmol/L (ref 134–144)
Total Protein: 7.6 g/dL (ref 6.0–8.5)

## 2019-11-04 LAB — CBC
Hematocrit: 41.3 % (ref 34.0–46.6)
Hemoglobin: 13.6 g/dL (ref 11.1–15.9)
MCH: 28.5 pg (ref 26.6–33.0)
MCHC: 32.9 g/dL (ref 31.5–35.7)
MCV: 86 fL (ref 79–97)
Platelets: 405 10*3/uL (ref 150–450)
RBC: 4.78 x10E6/uL (ref 3.77–5.28)
RDW: 12.7 % (ref 11.7–15.4)
WBC: 5.8 10*3/uL (ref 3.4–10.8)

## 2019-11-04 LAB — TSH: TSH: 2.43 u[IU]/mL (ref 0.450–4.500)

## 2019-11-05 NOTE — Progress Notes (Signed)
Normal CBC, CMP and TSH

## 2019-11-13 ENCOUNTER — Other Ambulatory Visit: Payer: Self-pay | Admitting: Cardiology

## 2019-11-13 DIAGNOSIS — E669 Obesity, unspecified: Secondary | ICD-10-CM

## 2019-11-13 MED ORDER — PHENTERMINE HCL 30 MG PO CAPS: 30.0000 mg | ORAL_CAPSULE | ORAL | 3 refills | Status: DC

## 2019-11-13 NOTE — Progress Notes (Signed)
ICD-10-CM   1. Mild obesity  E66.9 phentermine 30 MG capsule   30 Cap with 3 refills done today. Patient and haer husband aware of the risks and alternatives. They will continue to monitor the BP at home and monitor response.     Adrian Prows, MD, [facility] 11/13/2019, 1:48 PM Office: [phone]

## 2020-01-17 ENCOUNTER — Ambulatory Visit: Payer: BC Managed Care – PPO | Admitting: Obstetrics and Gynecology

## 2020-01-17 ENCOUNTER — Other Ambulatory Visit: Payer: Self-pay

## 2020-01-17 ENCOUNTER — Encounter: Payer: Self-pay | Admitting: Obstetrics and Gynecology

## 2020-01-17 VITALS — BP 130/76 | HR 76 | Resp 14 | Ht 63.75 in | Wt 184.8 lb

## 2020-01-17 DIAGNOSIS — R300 Dysuria: Secondary | ICD-10-CM

## 2020-01-17 DIAGNOSIS — R8290 Unspecified abnormal findings in urine: Secondary | ICD-10-CM

## 2020-01-17 DIAGNOSIS — Z01419 Encounter for gynecological examination (general) (routine) without abnormal findings: Secondary | ICD-10-CM

## 2020-01-17 DIAGNOSIS — Z1211 Encounter for screening for malignant neoplasm of colon: Secondary | ICD-10-CM | POA: Diagnosis not present

## 2020-01-17 DIAGNOSIS — N760 Acute vaginitis: Secondary | ICD-10-CM

## 2020-01-17 LAB — POCT URINALYSIS DIPSTICK
Bilirubin, UA: NEGATIVE
Glucose, UA: NEGATIVE
Ketones, UA: NEGATIVE
Nitrite, UA: NEGATIVE
Protein, UA: POSITIVE — AB
Urobilinogen, UA: 0.2 E.U./dL
pH, UA: 5 (ref 5.0–8.0)

## 2020-01-17 MED ORDER — PREMARIN 0.625 MG/GM VA CREA: TOPICAL_CREAM | VAGINAL | 2 refills | Status: DC

## 2020-01-17 MED ORDER — NYSTATIN-TRIAMCINOLONE 100000-0.1 UNIT/GM-% EX CREA: 1.0000 "application " | TOPICAL_CREAM | Freq: Two times a day (BID) | CUTANEOUS | 0 refills | Status: DC

## 2020-01-17 NOTE — Patient Instructions (Signed)

## 2020-01-17 NOTE — Progress Notes (Signed)
49 y.o. G11P1001 Married Caucasian female here for annual exam.   Patient complaining of vaginal itching x1 week. She used OTC medication last week and had some relief.  Also complaining of dysuria.  The pain is more when the urine touches the outside of the vagina.  Took of her estrogen patch 2 weeks ago and she feels better off the patch.   She wants to continue the vaginal estrogen cream once or twice per month.   She is having a lot of bloating when she is very hot at the beach. Feels like she is retaining fluid.   Regular BMs.  Received her Covid vaccine, completed in April.   Urine dip - trace RBC, trace WBC, trace protein.   PCP: none.  Cardiology:  Dr. Oralia Rud  Patient's last menstrual period was 10/14/2013.           Sexually active: Yes.    The current method of family planning is status post hysterectomy.    Exercising: Yes.    tennis and treadmill Smoker:  no  Health Maintenance: Pap: 10-18-13 Neg:Neg HR HPV History of abnormal Pap:  no MMG:  12-28-18 Diag.Bil./Neg/density C/screening 63yr/BiRads2 Colonoscopy:  NEVER BMD: 12-21-16  Result :Normal TDaP:  12-28-18 Gardasil:   no HIV:2010 Neg per patient Hep C:10-03-15 Neg Screening Labs:  Labs done in June 2021.    reports that she has never smoked. She has never used smokeless tobacco. She reports current alcohol use of about 2.0 standard drinks of alcohol per week. She reports that she does not use drugs.  Past Medical History:  Diagnosis Date   Allergic rhinitis    PONV (postoperative nausea and vomiting)     Past Surgical History:  Procedure Laterality Date   CESAREAN SECTION  1996   CHOLECYSTECTOMY  10/04/2013   CYST EXCISION Bilateral 1995   under both arms   CYSTOSCOPY N/A 11/21/2013   Procedure: CYSTOSCOPY;  Surgeon: Jamey Reas de Berton Lan, MD;  Location: [facility];  Service: Gynecology;  Laterality: N/A;   ROBOTIC ASSISTED TOTAL HYSTERECTOMY WITH BILATERAL SALPINGO OOPHERECTOMY  Bilateral 11/21/2013   Procedure: ROBOTIC ASSISTED TOTAL HYSTERECTOMY WITH BILATERAL SALPINGO OOPHORECTOMY;  Surgeon: Jamey Reas de Berton Lan, MD;  Location: [facility];  Service: Gynecology;  Laterality: Bilateral;    Current Outpatient Medications  Medication Sig Dispense Refill   conjugated estrogens (PREMARIN) vaginal cream USE 1/2 GRAM VAGINALLY TWICE PER WEEK AT BEDTIME 30 g 2   estradiol (MINIVELLE) 0.05 MG/24HR patch Place 1 patch (0.05 mg total) onto the skin 2 (two) times a week. 24 patch 3   No current facility-administered medications for this visit.    Family History  Problem Relation Age of Onset   Diabetes Mother    Hypertension Mother    Arthritis Mother    Hypertension Father    Arthritis Father    Breast cancer Neg Hx     Review of Systems  Genitourinary: Positive for dysuria.  All other systems reviewed and are negative.   Exam:   BP 130/76 (Cuff Size: Large)    Pulse 76    Resp 14    Ht 5' 3.75" (1.619 m)    Wt 184 lb 12.8 oz (83.8 kg)    LMP 10/14/2013    BMI 31.97 kg/m     General appearance: alert, cooperative and appears stated age Head: normocephalic, without obvious abnormality, atraumatic Neck: no adenopathy, supple, symmetrical, trachea midline and thyroid normal to inspection and palpation Lungs:  clear to auscultation bilaterally Breasts: normal appearance, no masses or tenderness, No nipple retraction or dimpling, No nipple discharge or bleeding, No axillary adenopathy Heart: regular rate and rhythm Abdomen: soft, non-tender; no masses, no organomegaly Extremities: extremities normal, atraumatic, no cyanosis or edema Skin: skin color, texture, turgor normal. No rashes or lesions Lymph nodes: cervical, supraclavicular, and axillary nodes normal. Neurologic: grossly normal  Pelvic: External genitalia:  Erythema of vulva.              No abnormal inguinal nodes palpated.              Urethra:  normal appearing urethra with no  masses, tenderness or lesions              Bartholins and Skenes: normal                 Vagina: normal appearing vagina with normal color and discharge, no lesions              Cervix: absent              Pap taken: No. Bimanual Exam:  Uterus:  absent              Adnexa: no mass, fullness, tenderness              Rectal exam: Yes.  .  Confirms.              Anus:  normal sphincter tone, no lesions  Chaperone was present for exam.  Assessment:   Well woman visit with normal exam. Status post robotic hysterectomy with BSO. Off ERT.  Vulvovaginitis.  Dysuria.  FH osteoporosis.  Plan: Mammogram screening discussed.  She will schedule.  Self breast awareness reviewed. Pap and HR HPV as above. Guidelines for Calcium, Vitamin D, regular exercise program including cardiovascular and weight bearing exercise. Referral to GI for colonoscopy.  Urine micro and cx.  Affirm.  Mycolog II.  She will follow up with Dr. Einar Gip regarding potential fluid retention.  Follow up annually and prn.   After visit summary provided.

## 2020-01-18 ENCOUNTER — Telehealth: Payer: Self-pay

## 2020-01-18 LAB — URINALYSIS, MICROSCOPIC ONLY
Bacteria, UA: NONE SEEN
Casts: NONE SEEN /lpf
Epithelial Cells (non renal): NONE SEEN /hpf (ref 0–10)
RBC: NONE SEEN /hpf (ref 0–2)
WBC, UA: NONE SEEN /hpf (ref 0–5)

## 2020-01-18 LAB — VAGINITIS/VAGINOSIS, DNA PROBE
Candida Species: POSITIVE — AB
Gardnerella vaginalis: NEGATIVE
Trichomonas vaginosis: NEGATIVE

## 2020-01-18 MED ORDER — FLUCONAZOLE 150 MG PO TABS: 150.0000 mg | ORAL_TABLET | Freq: Once | ORAL | 0 refills | Status: AC

## 2020-01-18 NOTE — Telephone Encounter (Signed)
Spoke with pt. Pt given results of Affirm per Dr Quincy Simmonds. Pt agreeable and verbalized understanding. Rx Diflucan sent to pharmacy on file.  Encounter closed.

## 2020-01-18 NOTE — Telephone Encounter (Signed)
-----   Message from Nunzio Cobbs, MD sent at 01/18/2020  2:45 PM EDT ----- Please contact patient with results of Affirm showing yeast in the vagina.  I recommend she take a course of Diflucan 150 mg po x 1.  She can repeat this in 72 hours prn.  Disp: 2 RF:  none.   I have already prescribed Mycolog II for external treatment of yeast.   Her urine culture is still in process.

## 2020-01-19 ENCOUNTER — Other Ambulatory Visit: Payer: Self-pay | Admitting: *Deleted

## 2020-01-19 LAB — URINE CULTURE

## 2020-01-19 MED ORDER — AMOXICILLIN 500 MG PO CAPS: 500.0000 mg | ORAL_CAPSULE | Freq: Two times a day (BID) | ORAL | 0 refills | Status: AC

## 2020-01-30 DIAGNOSIS — Z20822 Contact with and (suspected) exposure to covid-19: Secondary | ICD-10-CM | POA: Diagnosis not present

## 2020-01-31 ENCOUNTER — Other Ambulatory Visit: Payer: Self-pay | Admitting: Obstetrics and Gynecology

## 2020-01-31 DIAGNOSIS — Z1231 Encounter for screening mammogram for malignant neoplasm of breast: Secondary | ICD-10-CM

## 2020-02-13 ENCOUNTER — Other Ambulatory Visit: Payer: Self-pay | Admitting: Obstetrics and Gynecology

## 2020-02-16 ENCOUNTER — Other Ambulatory Visit: Payer: Self-pay

## 2020-02-16 MED ORDER — PHENTERMINE HCL 30 MG PO CAPS: 30.0000 mg | ORAL_CAPSULE | ORAL | 2 refills | Status: DC

## 2020-03-01 ENCOUNTER — Ambulatory Visit (INDEPENDENT_AMBULATORY_CARE_PROVIDER_SITE_OTHER): Payer: Self-pay | Admitting: Plastic Surgery

## 2020-03-01 ENCOUNTER — Encounter: Payer: Self-pay | Admitting: Plastic Surgery

## 2020-03-01 ENCOUNTER — Other Ambulatory Visit: Payer: Self-pay

## 2020-03-01 DIAGNOSIS — Z719 Counseling, unspecified: Secondary | ICD-10-CM

## 2020-03-01 NOTE — Progress Notes (Signed)
Patient ID: Brittany Kelly, female    DOB: [birth year], 49 y.o.   MRN: [id number]   Chief Complaint  Patient presents with  . Advice Only    49 year old female here with her husband and a Optometrist for a consultation for body rejuvenation.  She initially was asking about liposuction of the abdomen.  She is 5 feet 7 inches tall and weighs 187 pounds.  She has had a hysterectomy, C-section and a classic cholecystectomy.  She is not a diabetic and is otherwise healthy.  She does not like the way she can feels her abdominal rolls when she bends down.  There is no sign of a hernia or diastasis.  No pain with palpation.  She has a history of epigastric pain.  She understands surgery will not alter that discomfort.  She is also interested in liposuction of her arms at a later time.     Review of Systems  Constitutional: Negative.  Negative for activity change and appetite change.  HENT: Negative.   Eyes: Negative.   Respiratory: Negative.  Negative for chest tightness and shortness of breath.   Cardiovascular: Negative.  Negative for leg swelling.  Gastrointestinal: Negative.  Negative for abdominal distention and abdominal pain.  Endocrine: Negative.   Genitourinary: Negative.   Musculoskeletal: Negative.  Negative for gait problem.  Psychiatric/Behavioral: Negative.     Past Medical History:  Diagnosis Date  . Allergic rhinitis   . PONV (postoperative nausea and vomiting)     Past Surgical History:  Procedure Laterality Date  . CESAREAN SECTION  1996  . CHOLECYSTECTOMY  10/04/2013  . CYST EXCISION Bilateral 1995   under both arms  . CYSTOSCOPY N/A 11/21/2013   Procedure: CYSTOSCOPY;  Surgeon: Jamey Reas de Berton Lan, MD;  Location: [facility];  Service: Gynecology;  Laterality: N/A;  . ROBOTIC ASSISTED TOTAL HYSTERECTOMY WITH BILATERAL SALPINGO OOPHERECTOMY Bilateral 11/21/2013   Procedure: ROBOTIC ASSISTED TOTAL HYSTERECTOMY WITH BILATERAL SALPINGO OOPHORECTOMY;   Surgeon: Jamey Reas de Berton Lan, MD;  Location: [facility];  Service: Gynecology;  Laterality: Bilateral;      Current Outpatient Medications:  .  conjugated estrogens (PREMARIN) vaginal cream, USE 1/2 GRAM VAGINALLY TWICE PER WEEK AT BEDTIME, Disp: 30 g, Rfl: 2   Objective:   Vitals:   03/01/20 0918  BP: (!) 147/84  Pulse: 80  Temp: 98.8 F (37.1 C)  SpO2: 97%    Physical Exam Vitals and nursing note reviewed.  Constitutional:      Appearance: Normal appearance.  HENT:     Head: Normocephalic and atraumatic.  Cardiovascular:     Rate and Rhythm: Normal rate.     Pulses: Normal pulses.  Pulmonary:     Effort: Pulmonary effort is normal.  Abdominal:     General: Abdomen is flat. There is no distension.     Palpations: There is no mass.     Tenderness: There is no abdominal tenderness.     Hernia: No hernia is present.    Neurological:     General: No focal deficit present.     Mental Status: She is alert and oriented to person, place, and time. Mental status is at baseline.  Psychiatric:        Mood and Affect: Mood normal.        Behavior: Behavior normal.        Thought Content: Thought content normal.     Assessment & Plan:  Encounter for counseling  Referral  to healthy weight and wellness center. Recommend ~ 10 pound reduction for the upper abdomen to have a better result after the surgery.   She is a candidate for abdominoplasty with liposuction lateral and flanks.  Pictures were obtained of the patient and placed in the chart with the patient's or guardian's permission.  [place], DO

## 2020-03-11 ENCOUNTER — Other Ambulatory Visit: Payer: Self-pay

## 2020-03-11 ENCOUNTER — Ambulatory Visit
Admission: RE | Admit: 2020-03-11 | Discharge: 2020-03-11 | Disposition: A | Discharge status: Home / Self Care | Payer: BC Managed Care – PPO | Source: Ambulatory Visit | Attending: Obstetrics and Gynecology | Admitting: Obstetrics and Gynecology

## 2020-03-11 DIAGNOSIS — Z1231 Encounter for screening mammogram for malignant neoplasm of breast: Secondary | ICD-10-CM

## 2020-04-30 ENCOUNTER — Other Ambulatory Visit: Payer: Self-pay | Admitting: Obstetrics and Gynecology

## 2020-04-30 NOTE — Telephone Encounter (Signed)
Medication refill request: Estradiol  Last AEX:  01/17/20 Dr. Quincy Simmonds Next AEX: 01/22/21  Last MMG (if hormonal medication request): 03/11/20 BIRADS 1 negative/density c Refill authorized: today, please advise

## 2020-05-01 ENCOUNTER — Other Ambulatory Visit: Payer: Self-pay | Admitting: *Deleted

## 2020-05-01 NOTE — Telephone Encounter (Signed)
Medication refill request: Nystatin  Last AEX:  01-17-20 BS Next AEX: 01-22-21  Last MMG (if hormonal medication request): 03-11-20 density C/BIRADS 1 negative  Refill authorized: Today, please advise.   Medication pended for #60g, 0RF. Please refill if appropriate.

## 2020-11-21 IMAGING — MG DIGITAL SCREENING BILAT W/ TOMO W/ CAD
8 series · 8 of 24 positions shown · non-contrast
Comparison: Previous exam(s).

CLINICAL DATA: Screening.

EXAM:
DIGITAL SCREENING BILATERAL MAMMOGRAM WITH TOMO AND CAD

[R CC synth-2D]
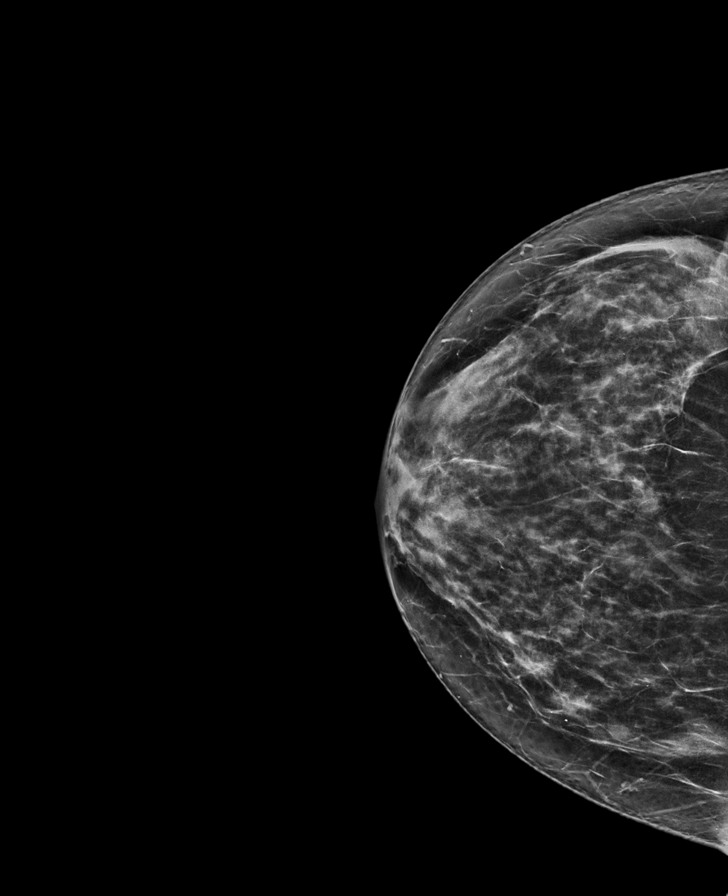

[L CC synth-2D]
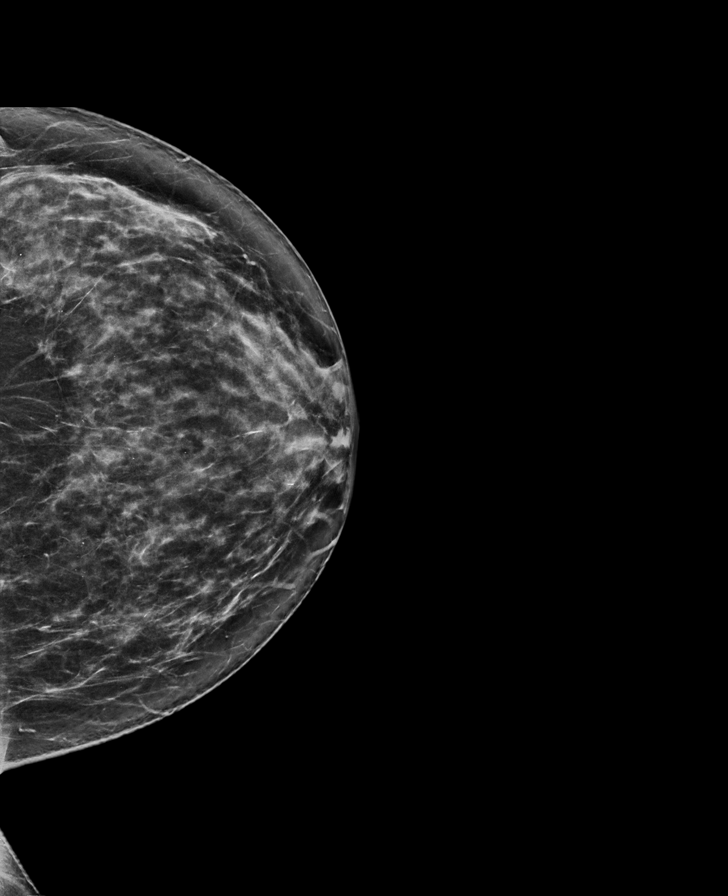

[L MLO synth-2D]
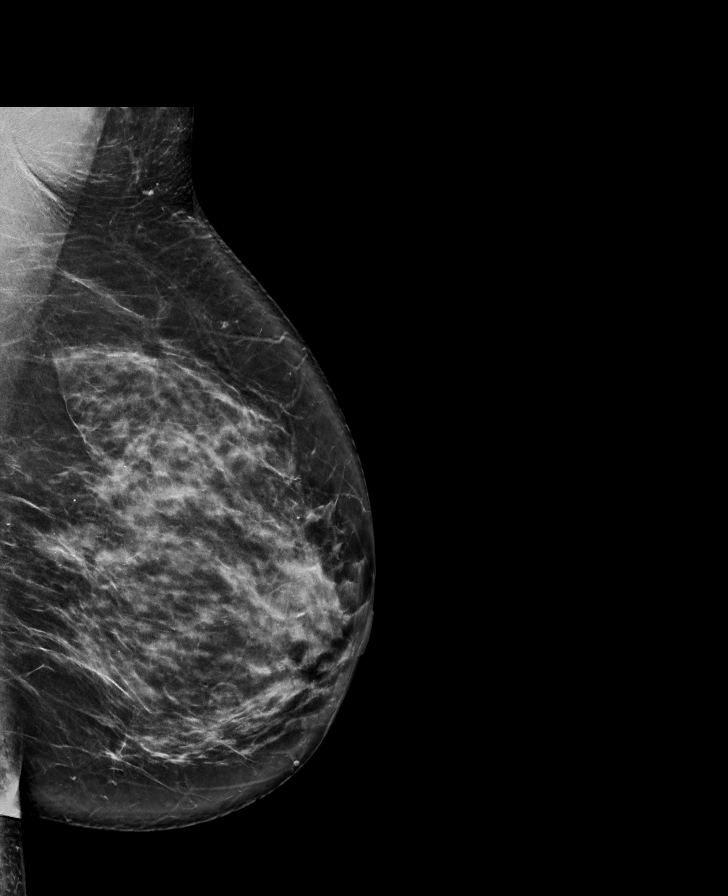

[R MLO synth-2D]
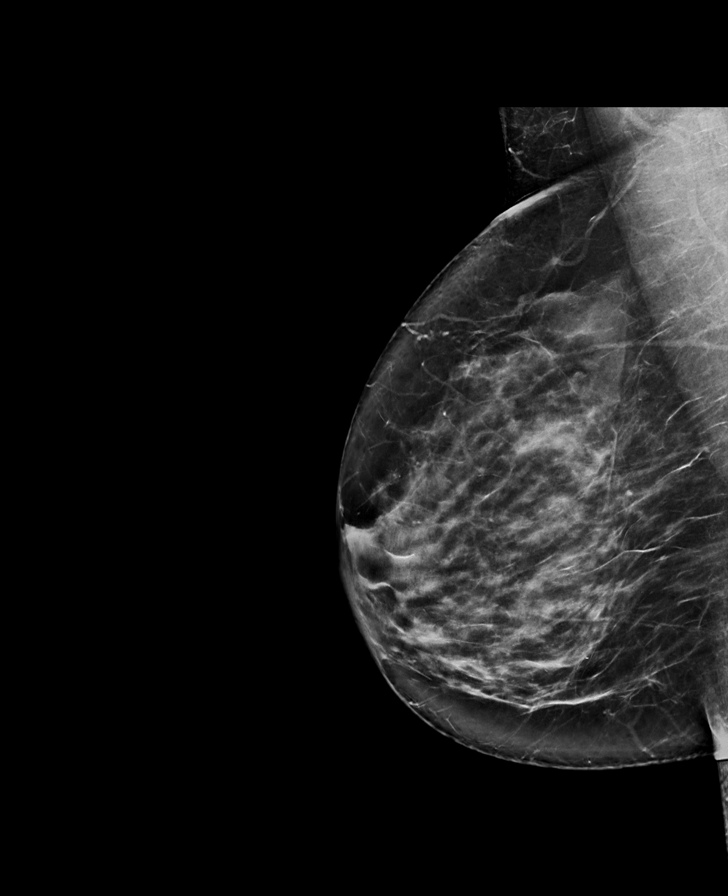

[R CC tomo · tomo slice 37/74.0]
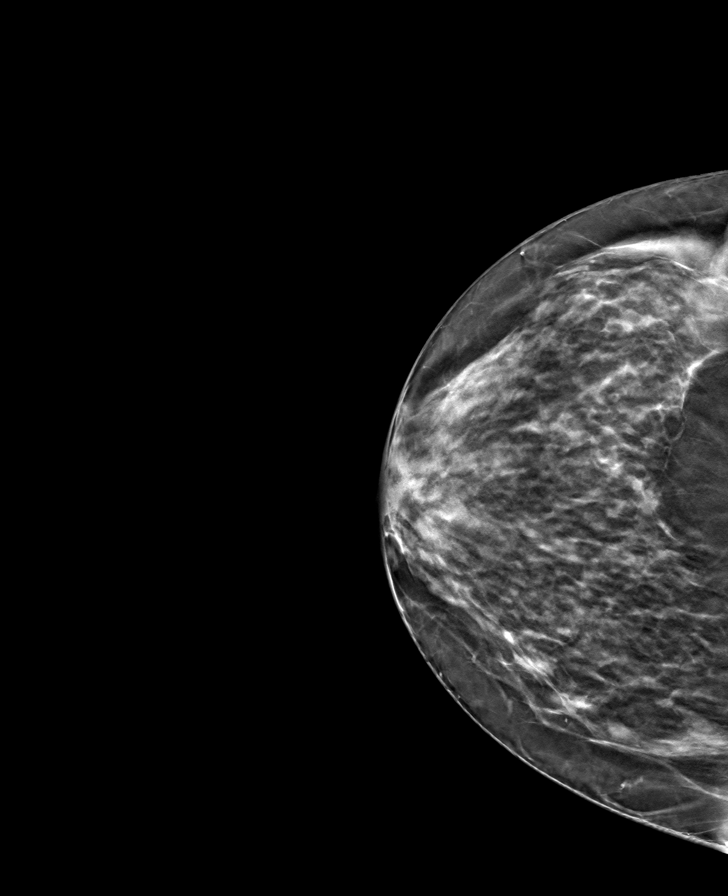

[L MLO tomo · tomo slice 43/85.0]
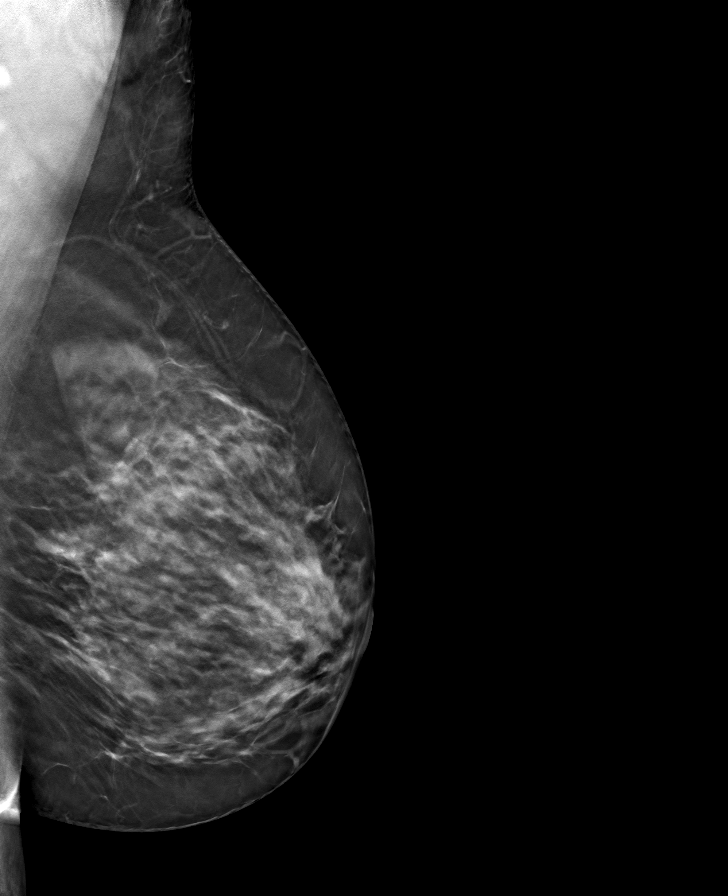

[R MLO tomo · tomo slice 45/90.0]
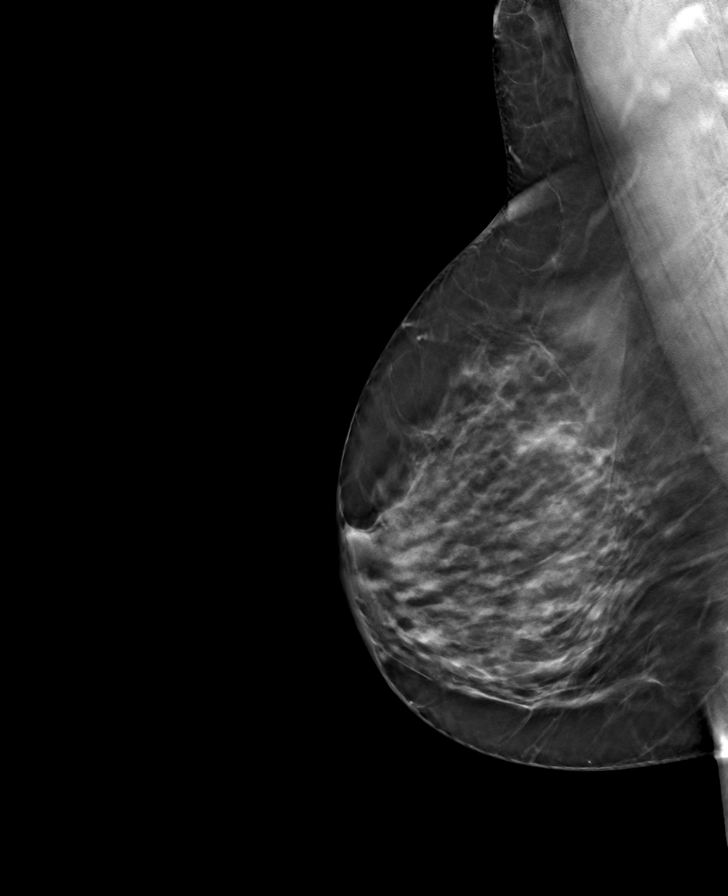

[L CC tomo · tomo slice 39/76.0]
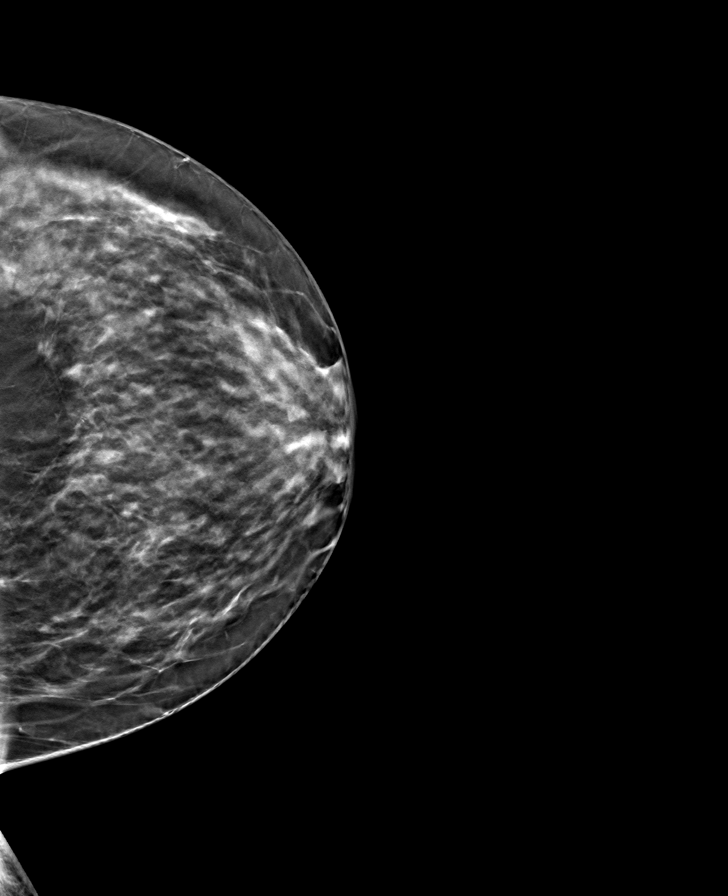

[8 of 24 positions shown; findings below may reference images not displayed]

ACR Breast Density Category c: The breast tissue is heterogeneously
dense, which may obscure small masses.
FINDINGS: There are no findings suspicious for malignancy. Images were
processed with CAD.
IMPRESSION: No mammographic evidence of malignancy. A result letter of this
screening mammogram will be mailed directly to the patient.

RECOMMENDATION:
Screening mammogram in one year. (Code:[id number])

BI-RADS CATEGORY  1: Negative.

## 2020-12-11 ENCOUNTER — Encounter: Payer: Self-pay | Admitting: Cardiology

## 2020-12-11 ENCOUNTER — Other Ambulatory Visit: Payer: Self-pay

## 2020-12-11 ENCOUNTER — Ambulatory Visit: Payer: BC Managed Care – PPO | Admitting: Cardiology

## 2020-12-11 VITALS — BP 119/80 | HR 84 | Temp 98.3°F | Resp 17 | Ht 67.0 in | Wt 189.0 lb

## 2020-12-11 DIAGNOSIS — R635 Abnormal weight gain: Secondary | ICD-10-CM

## 2020-12-11 DIAGNOSIS — E669 Obesity, unspecified: Secondary | ICD-10-CM | POA: Diagnosis not present

## 2020-12-11 MED ORDER — INSULIN PEN NEEDLE 32G X 4 MM MISC: 1.0000 | Freq: Every day | 2 refills | Status: DC

## 2020-12-11 MED ORDER — PHENTERMINE HCL 30 MG PO CAPS: 30.0000 mg | ORAL_CAPSULE | ORAL | 0 refills | Status: DC

## 2020-12-11 MED ORDER — SAXENDA 18 MG/3ML ~~LOC~~ SOPN: 3.0000 mg | PEN_INJECTOR | Freq: Every day | SUBCUTANEOUS | 6 refills | Status: DC

## 2020-12-11 NOTE — Progress Notes (Signed)
Primary Physician/Referring:  Janne Lab, MD  Patient ID: Brittany Kelly, female    DOB: 10-23-1970, 50 y.o.   MRN: [id number]  No chief complaint on file.   HPI: Brittany Kelly  is a 50 y.o. female  female  with obesity last seen in 2018 for weight management,  with mild obesity and no other CV risks plays tennis regularly, has been having difficulty with weight loss, has gained 20 pounds in weight over the past 6 months.  She had tried [place] in the past and is wondering if this would be the drug for her.  Continues to play tennis regularly, also goes to the gym to exercise.  Past Medical History:  Diagnosis Date   Allergic rhinitis    PONV (postoperative nausea and vomiting)    Past Surgical History:  Procedure Laterality Date   CESAREAN SECTION  1996   CHOLECYSTECTOMY  10/04/2013   CYST EXCISION Bilateral 1995   under both arms   CYSTOSCOPY N/A 11/21/2013   Procedure: CYSTOSCOPY;  Surgeon: Jamey Reas de Berton Lan, MD;  Location: [facility];  Service: Gynecology;  Laterality: N/A;   ROBOTIC ASSISTED TOTAL HYSTERECTOMY WITH BILATERAL SALPINGO OOPHERECTOMY Bilateral 11/21/2013   Procedure: ROBOTIC ASSISTED TOTAL HYSTERECTOMY WITH BILATERAL SALPINGO OOPHORECTOMY;  Surgeon: Jamey Reas de Berton Lan, MD;  Location: [facility];  Service: Gynecology;  Laterality: Bilateral;   Social History   Tobacco Use   Smoking status: Never   Smokeless tobacco: Never  Substance Use Topics   Alcohol use: Yes    Alcohol/week: 2.0 standard drinks    Types: 2 Standard drinks or equivalent per week   Marital Status: Married   Current Outpatient Medications on File Prior to Visit  Medication Sig Dispense Refill   conjugated estrogens (PREMARIN) vaginal cream USE 1/2 GRAM VAGINALLY TWICE PER WEEK AT BEDTIME 30 g 2   No current facility-administered medications on file prior to visit.   Review of Systems  Cardiovascular:  Negative for chest pain, dyspnea on  exertion and leg swelling.  Gastrointestinal:  Negative for melena.  Objective  Blood pressure 119/80, pulse 84, temperature 98.3 F (36.8 C), temperature source Temporal, resp. rate 17, height '5\' 7"'$  (1.702 m), weight 189 lb (85.7 kg), last menstrual period 10/14/2013, SpO2 98 %. Body mass index is 29.6 kg/m.  Vitals with BMI 12/11/2020 03/01/2020 01/17/2020  Height '5\' 7"'$  '5\' 7"'$  5' 3.75"  Weight 189 lbs 187 lbs 13 oz 184 lbs 13 oz  BMI 29.59 [id number]  Systolic 123456 [id number]  Diastolic 80 84 76  Pulse 84 80 76      Physical Exam Cardiovascular:     Rate and Rhythm: Normal rate and regular rhythm.     Pulses: Intact distal pulses.     Heart sounds: Normal heart sounds. No murmur heard.   No gallop.     Comments: No leg edema, no JVD. Pulmonary:     Effort: Pulmonary effort is normal.     Breath sounds: Normal breath sounds.  Abdominal:     General: Bowel sounds are normal.     Palpations: Abdomen is soft.   Radiology: No results found.,  Laboratory examination:   CMP Latest Ref Rng & Units 11/03/2019 01/28/2017 11/11/2016  Glucose 65 - 99 mg/dL 89 105(H) 99  BUN 6 - 24 mg/dL '16 17 15  '$ Creatinine 0.57 - 1.00 mg/dL 0.77 0.65 0.78  Sodium 134 - 144 mmol/L 142 140 141  Potassium 3.5 -  5.2 mmol/L 5.2 4.4 4.8  Chloride 96 - 106 mmol/L 103 102 101  CO2 20 - 29 mmol/L '24 28 25  '$ Calcium 8.7 - 10.2 mg/dL 10.0 9.6 9.7  Total Protein 6.0 - 8.5 g/dL 7.6 - 7.6  Total Bilirubin 0.0 - 1.2 mg/dL 0.9 - 0.9  Alkaline Phos 48 - 121 IU/L 113 - 99  AST 0 - 40 IU/L 19 - 22  ALT 0 - 32 IU/L 13 - 14   CBC Latest Ref Rng & Units 11/03/2019 12/14/2017 01/27/2017  WBC 3.4 - 10.8 x10E3/uL 5.8 7.1 6.9  Hemoglobin 11.1 - 15.9 g/dL 13.6 13.3 13.0  Hematocrit 34.0 - 46.6 % 41.3 40.9 36.9  Platelets 150 - 450 x10E3/uL 405 379 333   Lipid Panel     Component Value Date/Time   CHOL 207 (H) 12/14/2017 1325   TRIG 110 12/14/2017 1325   HDL 70 12/14/2017 1325   CHOLHDL 3.0 12/14/2017 1325   LDLCALC 115  (H) 12/14/2017 1325   NHDL 127  HEMOGLOBIN A1C Lab Results  Component Value Date   HGBA1C 5.4 12/14/2017   TSH No results for input(s): TSH in the last 8760 hours. Cardiac Studies:   EKG 12/11/2020: Normal sinus rhythm at rate of 82 bpm, normal axis.  Incomplete right bundle branch block.  No evidence of ischemia, normal EKG. NO SIGNIFICANT CHANGE FROM 09/04/2019.     Assessment     ICD-10-CM   1. Mild obesity  E66.9 EKG 12-Lead    Liraglutide -Weight Management (SAXENDA) 18 MG/3ML SOPN    Insulin Pen Needle 32G X 4 MM MISC    2. Weight gain  R63.5 phentermine 30 MG capsule      Recommendations:    Brittany Kelly  is a 50 y.o.  female  with obesity last seen in 2018 for weight management,  with mild obesity and no other CV risks plays tennis regularly, has been having difficulty with weight loss, has gained 20 pounds in weight over the past 6 months.  She had tried [place] in the past, I have prescribed this again.  Until she gets insurance straightened up, she would like to use phentermine which I given 30-day prescription as well.  I had a very lengthy discussion with the patient regarding joining wellness clinic.  At this time as patient has multiple club memberships and she prefers to make changes at home and would like to try the medication.  Side effects of medication discussed in detail.  Her husband is present at the bedside as well.  40-minute office visit encounter with lifestyle modification and related CV risks.   Adrian Prows, MD, [facility] 12/11/2020, 2:54 PM Office: [phone] Fax: [phone] Pager: [phone]

## 2020-12-16 ENCOUNTER — Telehealth: Payer: Self-pay | Admitting: Cardiology

## 2020-12-16 NOTE — Telephone Encounter (Signed)
Pt's husband called 8/1 stating that his wife's prescription has not been refilled. Says the pharmacy ([facility] in [place], [place]) told him they're waiting on PCV. Pt's husband would like a phone call to let him know when presc. has gone through so he can pick it up. [facility]

## 2020-12-18 NOTE — Telephone Encounter (Signed)
8/3 Pt's husband called stating prescription still has not gone through (called previously on 8/1), wondering who they are waiting on to approve the medication. Would like a call back to let them know once prescription has gone through. [facility]

## 2021-01-01 ENCOUNTER — Other Ambulatory Visit: Payer: Self-pay

## 2021-01-01 DIAGNOSIS — E669 Obesity, unspecified: Secondary | ICD-10-CM

## 2021-01-01 MED ORDER — SAXENDA 18 MG/3ML ~~LOC~~ SOPN: 3.0000 mg | PEN_INJECTOR | Freq: Every day | SUBCUTANEOUS | 6 refills | Status: DC

## 2021-01-08 DIAGNOSIS — Z0000 Encounter for general adult medical examination without abnormal findings: Secondary | ICD-10-CM | POA: Diagnosis not present

## 2021-01-08 DIAGNOSIS — R635 Abnormal weight gain: Secondary | ICD-10-CM | POA: Diagnosis not present

## 2021-01-08 DIAGNOSIS — E669 Obesity, unspecified: Secondary | ICD-10-CM | POA: Diagnosis not present

## 2021-01-08 DIAGNOSIS — Z1212 Encounter for screening for malignant neoplasm of rectum: Secondary | ICD-10-CM | POA: Diagnosis not present

## 2021-01-08 DIAGNOSIS — Z1211 Encounter for screening for malignant neoplasm of colon: Secondary | ICD-10-CM | POA: Diagnosis not present

## 2021-01-22 ENCOUNTER — Encounter: Payer: Self-pay | Admitting: Obstetrics and Gynecology

## 2021-01-22 ENCOUNTER — Other Ambulatory Visit: Payer: Self-pay

## 2021-01-22 ENCOUNTER — Ambulatory Visit (INDEPENDENT_AMBULATORY_CARE_PROVIDER_SITE_OTHER): Payer: BC Managed Care – PPO | Admitting: Obstetrics and Gynecology

## 2021-01-22 VITALS — BP 122/72 | HR 81 | Ht 63.0 in | Wt 188.0 lb

## 2021-01-22 DIAGNOSIS — R300 Dysuria: Secondary | ICD-10-CM | POA: Diagnosis not present

## 2021-01-22 DIAGNOSIS — Z01419 Encounter for gynecological examination (general) (routine) without abnormal findings: Secondary | ICD-10-CM

## 2021-01-22 MED ORDER — ESTRADIOL 0.05 MG/24HR TD PTTW: 1.0000 | MEDICATED_PATCH | TRANSDERMAL | 3 refills | Status: DC

## 2021-01-22 MED ORDER — PREMARIN 0.625 MG/GM VA CREA: TOPICAL_CREAM | VAGINAL | 2 refills | Status: DC

## 2021-01-22 NOTE — Patient Instructions (Signed)

## 2021-01-22 NOTE — Progress Notes (Signed)
50 y.o. G71P1001 Married Caucasian female here for annual exam.   Spanish interpretor present today.  Patient having RLQ burning with urinating.  Gained weight.  She saw her physician and she received a prescription but she has not taken it yet.   She wants to restart her estrogen patch.  She actually stopped only for 2 months because she had extra patches at home.  She is having a lot of hot flashes.  Needs refill of her estrogen cream, which she is using once or twice a month.  PCP:    Janne Lab, MD  Patient's last menstrual period was 10/14/2013.           Sexually active: Yes.    The current method of family planning is status post hysterectomy.    Exercising: Yes.     tennis Smoker:  no  Health Maintenance: Pap: 10-18-13 Neg:Neg HR HPV History of abnormal Pap:  no MMG:  03-11-20 3D/Neg/BiRads1 Colonoscopy: NEVER--WILL MAKE APPT. BMD:  12-21-16  Result :Normal TDaP:  12-28-18 Gardasil:   no HIV: 2010 Neg per patient Hep C: 10-03-15 Neg Screening Labs:  PCP.   reports that she has never smoked. She has never used smokeless tobacco. She reports current alcohol use of about 2.0 standard drinks per week. She reports that she does not use drugs.  Past Medical History:  Diagnosis Date   Allergic rhinitis    PONV (postoperative nausea and vomiting)     Past Surgical History:  Procedure Laterality Date   CESAREAN SECTION  1996   CHOLECYSTECTOMY  10/04/2013   CYST EXCISION Bilateral 1995   under both arms   CYSTOSCOPY N/A 11/21/2013   Procedure: CYSTOSCOPY;  Surgeon: Jamey Reas de Berton Lan, MD;  Location: [facility];  Service: Gynecology;  Laterality: N/A;   ROBOTIC ASSISTED TOTAL HYSTERECTOMY WITH BILATERAL SALPINGO OOPHERECTOMY Bilateral 11/21/2013   Procedure: ROBOTIC ASSISTED TOTAL HYSTERECTOMY WITH BILATERAL SALPINGO OOPHORECTOMY;  Surgeon: Jamey Reas de Berton Lan, MD;  Location: [facility];  Service: Gynecology;  Laterality: Bilateral;    Current  Outpatient Medications  Medication Sig Dispense Refill   conjugated estrogens (PREMARIN) vaginal cream USE 1/2 GRAM VAGINALLY TWICE PER WEEK AT BEDTIME 30 g 2   Insulin Pen Needle 32G X 4 MM MISC Inject 1 applicator into the skin daily. Use with Saxenda pen 100 each 2   No current facility-administered medications for this visit.    Family History  Problem Relation Age of Onset   Diabetes Mother    Hypertension Mother    Arthritis Mother    Hypertension Father    Arthritis Father    Breast cancer Neg Hx     Review of Systems  Genitourinary:  Positive for dysuria.  All other systems reviewed and are negative.  Exam:   BP 122/72   Pulse 81   Ht '5\' 3"'$  (1.6 m)   Wt 188 lb (85.3 kg)   LMP 10/14/2013   SpO2 98%   BMI 33.30 kg/m     General appearance: alert, cooperative and appears stated age Head: normocephalic, without obvious abnormality, atraumatic Neck: no adenopathy, supple, symmetrical, trachea midline and thyroid normal to inspection and palpation Lungs: clear to auscultation bilaterally Breasts: normal appearance, no masses or tenderness, No nipple retraction or dimpling, No nipple discharge or bleeding, No axillary adenopathy Heart: regular rate and rhythm Abdomen: soft, non-tender; no masses, no organomegaly Extremities: extremities normal, atraumatic, no cyanosis or edema Skin: skin color, texture, turgor normal. No  rashes or lesions Lymph nodes: cervical, supraclavicular, and axillary nodes normal. Neurologic: grossly normal  Pelvic: External genitalia:  no lesions              No abnormal inguinal nodes palpated.              Urethra:  normal appearing urethra with no masses, tenderness or lesions              Bartholins and Skenes: normal                 Vagina: normal appearing vagina with normal color and discharge, no lesions              Cervix: absent              Pap taken: no Bimanual Exam:  Uterus:  absent              Adnexa: no mass, fullness,  tenderness              Rectal exam: yes.  Confirms.              Anus:  normal sphincter tone, no lesions  Chaperone was present for exam:  Estill Bamberg, CMA.  Assessment:   Well woman visit with gynecologic exam. Dysuria.  Status post robotic hysterectomy with BSO. Off ERT.  Vulvovaginitis.  FH osteoporosis.   Plan: Mammogram screening discussed. Self breast awareness reviewed. Pap and HR HPV as above. Guidelines for Calcium, Vitamin D, regular exercise program including cardiovascular and weight bearing exercise. Urinalysis:  sg 1.101, pH 5.5, NS WBC, 0 - 2 RBC, 0 - 5 squams, NS bacteria.  UC sent.  No abx at this time. Refill of transdermal estrogen and vaginal estrogen.  I discussed increased risk of stroke, DVT, and PE with ERT.  She wishes to continue this. I encouraged use of the vaginal estrogen at least twice weekly  Follow up annually and prn.   After visit summary provided.

## 2021-01-23 LAB — URINALYSIS, COMPLETE W/RFL CULTURE
Bacteria, UA: NONE SEEN /HPF
Bilirubin Urine: NEGATIVE
Casts: NONE SEEN /LPF
Crystals: NONE SEEN /HPF
Glucose, UA: NEGATIVE
Hyaline Cast: NONE SEEN /LPF
Ketones, ur: NEGATIVE
Leukocyte Esterase: NEGATIVE
Nitrites, Initial: NEGATIVE
Protein, ur: NEGATIVE
Specific Gravity, Urine: 1.01 (ref 1.001–1.035)
WBC, UA: NONE SEEN /HPF (ref 0–5)
Yeast: NONE SEEN /HPF
pH: 5.5 (ref 5.0–8.0)

## 2021-01-23 LAB — URINE CULTURE
MICRO NUMBER:: 12341352
SPECIMEN QUALITY:: ADEQUATE

## 2021-01-23 LAB — CULTURE INDICATED

## 2021-01-25 MED ORDER — CEPHALEXIN 500 MG PO CAPS: 500.0000 mg | ORAL_CAPSULE | Freq: Two times a day (BID) | ORAL | 0 refills | Status: DC

## 2021-01-25 NOTE — Addendum Note (Signed)
Addended by: Yisroel Ramming, Dietrich Pates E on: 01/25/2021 09:49 AM   Modules accepted: Orders

## 2021-01-29 ENCOUNTER — Other Ambulatory Visit: Payer: Self-pay | Admitting: Obstetrics and Gynecology

## 2021-01-29 DIAGNOSIS — Z1231 Encounter for screening mammogram for malignant neoplasm of breast: Secondary | ICD-10-CM

## 2021-03-12 ENCOUNTER — Ambulatory Visit: Payer: BC Managed Care – PPO

## 2021-03-12 DIAGNOSIS — M65849 Other synovitis and tenosynovitis, unspecified hand: Secondary | ICD-10-CM | POA: Diagnosis not present

## 2021-03-12 DIAGNOSIS — M79645 Pain in left finger(s): Secondary | ICD-10-CM | POA: Diagnosis not present

## 2021-03-14 ENCOUNTER — Ambulatory Visit: Payer: BC Managed Care – PPO

## 2021-04-09 ENCOUNTER — Other Ambulatory Visit: Payer: Self-pay

## 2021-04-09 ENCOUNTER — Ambulatory Visit
Admission: RE | Admit: 2021-04-09 | Discharge: 2021-04-09 | Disposition: A | Discharge status: Home / Self Care | Payer: BC Managed Care – PPO | Source: Ambulatory Visit | Attending: Obstetrics and Gynecology | Admitting: Obstetrics and Gynecology

## 2021-04-09 DIAGNOSIS — Z1231 Encounter for screening mammogram for malignant neoplasm of breast: Secondary | ICD-10-CM | POA: Diagnosis not present

## 2021-06-13 ENCOUNTER — Ambulatory Visit: Payer: BC Managed Care – PPO | Admitting: Cardiology

## 2022-01-13 DIAGNOSIS — Z1211 Encounter for screening for malignant neoplasm of colon: Secondary | ICD-10-CM | POA: Diagnosis not present

## 2022-01-13 DIAGNOSIS — Z0000 Encounter for general adult medical examination without abnormal findings: Secondary | ICD-10-CM | POA: Diagnosis not present

## 2022-01-13 DIAGNOSIS — Z6835 Body mass index (BMI) 35.0-35.9, adult: Secondary | ICD-10-CM | POA: Diagnosis not present

## 2022-01-13 DIAGNOSIS — Z13220 Encounter for screening for lipoid disorders: Secondary | ICD-10-CM | POA: Diagnosis not present

## 2022-01-28 ENCOUNTER — Ambulatory Visit: Payer: BC Managed Care – PPO | Admitting: Obstetrics and Gynecology

## 2022-03-24 ENCOUNTER — Encounter: Payer: Self-pay | Admitting: Nurse Practitioner

## 2022-03-24 ENCOUNTER — Ambulatory Visit: Payer: BC Managed Care – PPO | Admitting: Nurse Practitioner

## 2022-03-24 VITALS — BP 132/82 | HR 72

## 2022-03-24 DIAGNOSIS — R1031 Right lower quadrant pain: Secondary | ICD-10-CM

## 2022-03-24 DIAGNOSIS — R300 Dysuria: Secondary | ICD-10-CM

## 2022-03-24 DIAGNOSIS — N898 Other specified noninflammatory disorders of vagina: Secondary | ICD-10-CM

## 2022-03-24 LAB — URINALYSIS, COMPLETE W/RFL CULTURE
Bacteria, UA: NONE SEEN /HPF
Bilirubin Urine: NEGATIVE
Glucose, UA: NEGATIVE
Hyaline Cast: NONE SEEN /LPF
Ketones, ur: NEGATIVE
Leukocyte Esterase: NEGATIVE
Nitrites, Initial: NEGATIVE
Protein, ur: NEGATIVE
RBC / HPF: NONE SEEN /HPF (ref 0–2)
Specific Gravity, Urine: 1.006 (ref 1.001–1.035)
WBC, UA: NONE SEEN /HPF (ref 0–5)
pH: 5.5 (ref 5.0–8.0)

## 2022-03-24 LAB — WET PREP FOR TRICH, YEAST, CLUE

## 2022-03-24 LAB — NO CULTURE INDICATED

## 2022-03-24 NOTE — Progress Notes (Signed)
   Acute Office Visit  Subjective:    Patient ID: Brittany Kelly, female    DOB: 1970-08-24, 51 y.o.   MRN: [id number]   HPI 51 y.o. presents today for RLQ pain and discomfort that started about 5 days ago. Feels it may be related to urinating. Denies frequency, urgency, or hematuria. Reports it is somewhat improved but still there. Also having some vaginal discharge without itching or odor. S/P hysterectomy with BSO. Denies any changes in bowels. Did start working out recently. Audio spanish interpreter used during visit.    Review of Systems  Constitutional: Negative.   Gastrointestinal:  Positive for abdominal pain (RLQ). Negative for abdominal distention, constipation, diarrhea, nausea and vomiting.  Genitourinary:  Positive for dysuria and vaginal discharge. Negative for flank pain, frequency, hematuria and urgency.       Objective:    Physical Exam Constitutional:      Appearance: Normal appearance.  Abdominal:     Palpations: Abdomen is soft.     Tenderness: There is no abdominal tenderness. There is no right CVA tenderness, left CVA tenderness, guarding or rebound.  Genitourinary:    General: Normal vulva.     Uterus: Absent.      BP 132/82   Pulse 72   LMP 10/13/2013   SpO2 94%  Wt Readings from Last 3 Encounters:  01/22/21 188 lb (85.3 kg)  12/11/20 189 lb (85.7 kg)  03/01/20 187 lb 12.8 oz (85.2 kg)        Patient informed chaperone available to be present for breast and/or pelvic exam. Patient has requested no chaperone to be present. Patient has been advised what will be completed during breast and pelvic exam.   UA negative Wet prep negative  Assessment & Plan:   Problem List Items Addressed This Visit   None Visit Diagnoses     Vaginal discharge    -  Primary   Relevant Orders   WET PREP FOR [place], YEAST, CLUE   Dysuria       Relevant Orders   Urinalysis,Complete w/RFL Culture   Right lower quadrant abdominal pain       Relevant  Orders   Urinalysis,Complete w/RFL Culture      Plan: Negative exam, UA and wet prep. Likely musculoskeletal. Recommend rest, heat and Ibuprofen/Tylenol. May take 4-6 weeks for full recovery. If no improvement or symptoms worsen she will follow up with PCP.      Tamela Gammon DNP, 12:44 PM 03/24/2022

## 2022-05-14 NOTE — Progress Notes (Deleted)
51 y.o. G42P1001 [name] here for annual exam.    PCP:     Patient's last menstrual period was 10/13/2013.           Sexually active: {yes no:314532}  The current method of family planning is status post hysterectomy.    Exercising: {yes no:314532}  {types:19826} Smoker:  {YES NO:22349}  Health Maintenance: Pap:  10-18-13 Neg:Neg HR HPV  History of abnormal Pap:  no MMG:  04/09/21 Breast Density Category C, BI-RADS CATEGORY 1 Negative Colonoscopy:  n/a BMD:   12/21/16  Result  normal TDaP:  12/28/18 Gardasil:   no HIV: 2010 neg per pt Hep C: 10/03/15 neg Screening Labs:  Hb today: ***, Urine today: ***   reports that she has never smoked. She has never used smokeless tobacco. She reports current alcohol use of about 2.0 standard drinks of alcohol per week. She reports that she does not use drugs.  Past Medical History:  Diagnosis Date   Allergic rhinitis    PONV (postoperative nausea and vomiting)     Past Surgical History:  Procedure Laterality Date   CESAREAN SECTION  1996   CHOLECYSTECTOMY  10/04/2013   CYST EXCISION Bilateral 1995   under both arms   CYSTOSCOPY N/A 11/21/2013   Procedure: CYSTOSCOPY;  Surgeon: Jamey Reas de Berton Lan, MD;  Location: [facility];  Service: Gynecology;  Laterality: N/A;   ROBOTIC ASSISTED TOTAL HYSTERECTOMY WITH BILATERAL SALPINGO OOPHERECTOMY Bilateral 11/21/2013   Procedure: ROBOTIC ASSISTED TOTAL HYSTERECTOMY WITH BILATERAL SALPINGO OOPHORECTOMY;  Surgeon: Jamey Reas de Berton Lan, MD;  Location: [facility];  Service: Gynecology;  Laterality: Bilateral;    Current Outpatient Medications  Medication Sig Dispense Refill   conjugated estrogens (PREMARIN) vaginal cream USE 1/2 GRAM VAGINALLY TWICE PER WEEK AT BEDTIME 30 g 2   estradiol (MINIVELLE) 0.05 MG/24HR patch Place 1 patch (0.05 mg total) onto the skin once a week. Apply patch to skin of the lower abdomen twice a week. 24 patch 3   No current  facility-administered medications for this visit.    Family History  Problem Relation Age of Onset   Diabetes Mother    Hypertension Mother    Arthritis Mother    Hypertension Father    Arthritis Father    Breast cancer Neg Hx     Review of Systems  Exam:   LMP 10/13/2013     General appearance: alert, cooperative and appears stated age Head: normocephalic, without obvious abnormality, atraumatic Neck: no adenopathy, supple, symmetrical, trachea midline and thyroid normal to inspection and palpation Lungs: clear to auscultation bilaterally Breasts: normal appearance, no masses or tenderness, No nipple retraction or dimpling, No nipple discharge or bleeding, No axillary adenopathy Heart: regular rate and rhythm Abdomen: soft, non-tender; no masses, no organomegaly Extremities: extremities normal, atraumatic, no cyanosis or edema Skin: skin color, texture, turgor normal. No rashes or lesions Lymph nodes: cervical, supraclavicular, and axillary nodes normal. Neurologic: grossly normal  Pelvic: External genitalia:  no lesions              No abnormal inguinal nodes palpated.              Urethra:  normal appearing urethra with no masses, tenderness or lesions              Bartholins and Skenes: normal                 Vagina: normal appearing vagina with normal color and  discharge, no lesions              Cervix: no lesions              Pap taken: {yes no:314532} Bimanual Exam:  Uterus:  normal size, contour, position, consistency, mobility, non-tender              Adnexa: no mass, fullness, tenderness              Rectal exam: {yes no:314532}.  Confirms.              Anus:  normal sphincter tone, no lesions  Chaperone was present for exam:  ***  Assessment:   Well woman visit with gynecologic exam.   Plan: Mammogram screening discussed. Self breast awareness reviewed. Pap and HR HPV as above. Guidelines for Calcium, Vitamin D, regular exercise program including  cardiovascular and weight bearing exercise.   Follow up annually and prn.   Additional counseling given.  {yes [id number]. _______ minutes face to face time of which over 50% was spent in counseling.    After visit summary provided.

## 2022-05-27 ENCOUNTER — Ambulatory Visit: Payer: BC Managed Care – PPO | Admitting: Obstetrics and Gynecology

## 2022-08-25 NOTE — Progress Notes (Deleted)
52 y.o. G18P1001 Married other female here for annual exam.    PCP:     Patient's last menstrual period was 10/13/2013.           Sexually active: {yes no:314532}  The current method of family planning is status post hysterectomy.    Exercising: {yes no:314532}  {types:19826} Smoker:  no  Health Maintenance: Pap:  10/18/13 neg: HR HPV neg History of abnormal Pap:  no MMG:  04/09/21 Breast Density Category C, BI-RADS CAT 1 neg Colonoscopy:  n/a BMD:   12/21/16  Result  normal TDaP:  12/28/18 Gardasil:   no HIV: 2010 neg per pt Hep C: 10/03/15 neg Screening Labs:  Hb today: ***, Urine today: ***   reports that she has never smoked. She has never used smokeless tobacco. She reports current alcohol use of about 2.0 standard drinks of alcohol per week. She reports that she does not use drugs.  Past Medical History:  Diagnosis Date   Allergic rhinitis    PONV (postoperative nausea and vomiting)     Past Surgical History:  Procedure Laterality Date   CESAREAN SECTION  1996   CHOLECYSTECTOMY  10/04/2013   CYST EXCISION Bilateral 1995   under both arms   CYSTOSCOPY N/A 11/21/2013   Procedure: CYSTOSCOPY;  Surgeon: Jacqualin Combes de Gwenevere Ghazi, MD;  Location: [facility];  Service: Gynecology;  Laterality: N/A;   ROBOTIC ASSISTED TOTAL HYSTERECTOMY WITH BILATERAL SALPINGO OOPHERECTOMY Bilateral 11/21/2013   Procedure: ROBOTIC ASSISTED TOTAL HYSTERECTOMY WITH BILATERAL SALPINGO OOPHORECTOMY;  Surgeon: Jacqualin Combes de Gwenevere Ghazi, MD;  Location: [facility];  Service: Gynecology;  Laterality: Bilateral;    Current Outpatient Medications  Medication Sig Dispense Refill   conjugated estrogens (PREMARIN) vaginal cream USE 1/2 GRAM VAGINALLY TWICE PER WEEK AT BEDTIME 30 g 2   estradiol (MINIVELLE) 0.05 MG/24HR patch Place 1 patch (0.05 mg total) onto the skin once a week. Apply patch to skin of the lower abdomen twice a week. 24 patch 3   No current facility-administered medications for  this visit.    Family History  Problem Relation Age of Onset   Diabetes Mother    Hypertension Mother    Arthritis Mother    Hypertension Father    Arthritis Father    Breast cancer Neg Hx     Review of Systems  Exam:   LMP 10/13/2013     General appearance: alert, cooperative and appears stated age Head: normocephalic, without obvious abnormality, atraumatic Neck: no adenopathy, supple, symmetrical, trachea midline and thyroid normal to inspection and palpation Lungs: clear to auscultation bilaterally Breasts: normal appearance, no masses or tenderness, No nipple retraction or dimpling, No nipple discharge or bleeding, No axillary adenopathy Heart: regular rate and rhythm Abdomen: soft, non-tender; no masses, no organomegaly Extremities: extremities normal, atraumatic, no cyanosis or edema Skin: skin color, texture, turgor normal. No rashes or lesions Lymph nodes: cervical, supraclavicular, and axillary nodes normal. Neurologic: grossly normal  Pelvic: External genitalia:  no lesions              No abnormal inguinal nodes palpated.              Urethra:  normal appearing urethra with no masses, tenderness or lesions              Bartholins and Skenes: normal                 Vagina: normal appearing vagina with normal color and discharge,  no lesions              Cervix: no lesions              Pap taken: {yes no:314532} Bimanual Exam:  Uterus:  normal size, contour, position, consistency, mobility, non-tender              Adnexa: no mass, fullness, tenderness              Rectal exam: {yes no:314532}.  Confirms.              Anus:  normal sphincter tone, no lesions  Chaperone was present for exam:  ***  Assessment:   Well woman visit with gynecologic exam.   Plan: Mammogram screening discussed. Self breast awareness reviewed. Pap and HR HPV as above. Guidelines for Calcium, Vitamin D, regular exercise program including cardiovascular and weight bearing exercise.    Follow up annually and prn.   Additional counseling given.  {yes [id number]. _______ minutes face to face time of which over 50% was spent in counseling.    After visit summary provided.

## 2022-09-08 ENCOUNTER — Ambulatory Visit: Payer: BC Managed Care – PPO | Admitting: Obstetrics and Gynecology

## 2022-09-10 ENCOUNTER — Encounter: Payer: Self-pay | Admitting: Podiatry

## 2022-09-10 ENCOUNTER — Ambulatory Visit (INDEPENDENT_AMBULATORY_CARE_PROVIDER_SITE_OTHER): Payer: 59

## 2022-09-10 ENCOUNTER — Ambulatory Visit: Payer: 59 | Admitting: Podiatry

## 2022-09-10 DIAGNOSIS — M2011 Hallux valgus (acquired), right foot: Secondary | ICD-10-CM | POA: Diagnosis not present

## 2022-09-10 DIAGNOSIS — M7731 Calcaneal spur, right foot: Secondary | ICD-10-CM

## 2022-09-10 DIAGNOSIS — M7661 Achilles tendinitis, right leg: Secondary | ICD-10-CM | POA: Diagnosis not present

## 2022-09-10 MED ORDER — MELOXICAM 15 MG PO TABS: 15.0000 mg | ORAL_TABLET | Freq: Every day | ORAL | 0 refills | Status: AC | PRN

## 2022-09-10 NOTE — Patient Instructions (Addendum)
If you are not using the oral anti-inflammatory medicaiton (mobic) then you can use topical Voltaren  For inserts I like superfeet, powersteps, aetrex   --- Rehabilitacin de la tendinitis de Aquiles Achilles Tendinitis Rehab Pregunte al mdico qu ejercicios son seguros para usted. Haga los ejercicios exactamente como se lo haya dicho el mdico y gradelos como se lo haya indicado. Es normal sentir un leve estiramiento, tironeo, opresin o Dentist al [employer] ejercicios. Detngase de inmediato si siente un dolor repentino o Community education officer. No comience a hacer estos ejercicios hasta que se lo indique el mdico. Ejercicios de elongacin y amplitud de movimiento Estos ejercicios precalientan los msculos y las articulaciones. Mejoran el movimiento y la flexibilidad del tobillo. Adems, ayudan a Engineer, materials. De pie frente a la pared, estiramiento de pantorrilla con la rodilla extendida  Prese con las manos [employer] pared. Extienda la pierna izquierda/derecha hacia atrs y flexione ligeramente la rodilla de la pierna de adelante. Mantenga ambos talones apoyados en el suelo. Los dedos del pie de atrs deben apuntar ligeramente hacia adentro. Mantenga los talones apoyados en el suelo y la rodilla de atrs extendida, y lleve el peso hacia la pared. No deje que la espalda se arquee. Debe sentir un ligero estiramiento en la parte superior de la pantorrilla. Mantenga esta posicin durante __________ segundos. Repita __________ veces. Realice este ejercicio __________ veces al da. De pie frente a la pared, estiramiento de pantorrilla con la rodilla flexionada  Prese con las manos [employer] pared. Extienda la pierna izquierda/derecha hacia atrs y flexione ligeramente la rodilla de la pierna de adelante. Mantenga ambos talones apoyados en el suelo. Los dedos del pie de atrs deben apuntar ligeramente hacia adentro. Mantenga los talones apoyados en el suelo y flexione la  rodilla de atrs ligeramente. Debe sentir un estiramiento suave profundo en la parte inferior de la pantorrilla, cerca del taln. Mantenga esta posicin durante __________ segundos. Repita __________ veces. Realice este ejercicio __________ veces al da. Ejercicios de fortalecimiento Estos ejercicios fortalecen el tobillo y le otorgan un mayor control de [place]. La resistencia es la capacidad de usar los msculos durante un tiempo prolongado, incluso despus de que se cansen. Flexin plantar con banda En este ejercicio, empuja los dedos hacia abajo, en direccin contraria a usted, con una banda para ejercicios que ofrece resistencia. Sintese en el suelo con la pierna izquierda/derecha extendida. Puede colocar una almohada debajo de la pantorrilla para que el pie tenga ms espacio para moverse. Coloque una banda de goma o soga elstica para ejercicios en la regin metatarsiana del pie izquierdo/derecho. La regin metatarsiana del pie es la superficie sobre la que caminamos, justo debajo de los dedos. La banda o soga elstica debe estar ligeramente tensa cuando el pie est relajado. Si la banda o la soga elstica se resbala, puede ponerse calzado o colocar un pao entre la banda y el pie, para ayudar a que no se salga. Lentamente, apunte los dedos hacia abajo, empujndolos en direccin contraria a su cuerpo (flexin plantar). Mantenga esta posicin durante __________ segundos. Libere lentamente la tensin de la banda o soga elstica, [place], hasta que el pie regrese a la posicin inicial. Repita los pasos 1 a 5 con la pierna izquierda/derecha. Repita __________ veces. Realice este ejercicio __________ veces al da. Bajada del taln, ejercicio excntrico  En este ejercicio, se pone de pie, levanta lentamente el taln y luego lo baja lentamente. Este ejercicio [employer] msculos de la pantorrilla (ejercicio  excntrico) mientras el pie soporta el peso. Si este ejercicio es muy fcil, intente  hacerlo con una mochila cargada con peso en la espalda. Prese sobre un escaln apoyando solo la regin metatarsiana de los pies. La regin metatarsiana del pie es la superficie sobre la que caminamos, justo debajo de los dedos. No apoye los talones en el escaln. Para mantener el equilibrio, apoye las [employer] pared o en una baranda. Prese sobre la regin metatarsiana de los pies. Con los talones levantados, pase todo el peso del cuerpo a la pierna izquierda/derecha y levante la otra pierna. Baje lentamente la pierna izquierda/derecha hasta que el taln quede por debajo del nivel del escaln. Apoye el otro pie antes de volver a la posicin inicial. Si se lo indic el mdico, aumente la intensidad de los ejercicios de esta manera: 3 series de 15 repeticiones con las rodillas extendidas. 3 series de 15 repeticiones con las rodillas ligeramente flexionadas hasta donde se lo haya indicado el mdico. Repita __________ veces. Realice este ejercicio __________ veces al da. Ejercicios de equilibrio Estos ejercicios mejoran o mantienen el equilibrio. El equilibrio [place] a Radio producer cadas. Pararse sobre una pierna Si este ejercicio es muy fcil, puede intentar hacerlo con los ojos cerrados o parado sobre [place]. Sin calzado, prese cerca de una baranda o en el marco de [place]. Sostngase de la baranda o del marco de la puerta segn lo necesite. Prese sobre el pie izquierdo/derecho. Sin despegar el dedo gordo del suelo, intente mantener el arco levantado. Debe sentir un estiramiento en la parte de abajo del pie y el arco. No deje que el pie se vaya hacia adentro. Mantenga esta posicin durante __________ segundos. Repita __________ veces. Realice este ejercicio __________ veces al da. Esta informacin no tiene Theme park manager el consejo del mdico. Asegrese de hacerle al mdico cualquier pregunta que tenga. Document Revised: 03/11/2022 Document Reviewed: 03/11/2022 Elsevier  Patient Education  2023 Elsevier Inc.   --- [place] Bunion Un juanete (hallux valgus) es una protuberancia que se forma lentamente en el lado interno de la articulacin del dedo gordo del pie. Se produce cuando el dedo gordo [employer]. Lo juanetes pueden ser pequeos al principio, aunque generalmente se agrandan con el transcurso del [place]. Pueden causar dolor al caminar. Cules son las causas? Esta afeccin puede ser causada por lo siguiente: Usar zapatos angostos o puntiagudos que obligan al dedo gordo a presionar United Station[employer] otros dedos. Un desarrollo anormal del pie causado por caer hacia adentro. Los [employer] son causados por ciertas enfermedades, como la artritis reumatoide o la poliomielitis. Una lesin en el pie. Qu incrementa el riesgo? Los siguientes factores pueden hacer que sea ms propenso a Clinical cytogeneticist afeccin: Usar zapatos que [employer] dedos del pie. Tener [employer]xon [employer]il Corporation, tales como: Artritis reumatoide. Poliomielitis. Parlisis cerebral. Tener antecedentes familiares de juanete. Haber nacido con una forma anormal en los pies (una deformidad en los pies), como por ejemplo, pie plano o arco vencido. Hacer actividades que presionan [employer], Passenger transport manager. Cules son los signos o sntomas?  El sntoma principal de esta afeccin es una protuberancia en el dedo gordo del pie que se nota a simple vista. Otros sntomas pueden incluir: Dolor. Enrojecimiento e inflamacin alrededor del dedo gordo del pie. Piel gruesa o endurecida en el dedo gordo o entre los dedos del pie. Rigidez o prdida del movimiento del dedo gordo. Dificultad para caminar. Cmo se diagnostica? Esta afeccin  puede diagnosticarse en funcin de los sntomas, los antecedentes mdicos y [place]. Adems, pueden hacerle pruebas y estudios de diagnstico por imgenes, por ejemplo: Radiografas. Estas permiten al mdico evaluar la posicin de los [employer] Energy y Engineer, manufacturing si hay dao en la articulacin. Tambin permiten al [employer] determinar la gravedad del juanete y la mejor [place] de tratarlo. Aspiracin de una articulacin. En [employer], se extrae Lauris Poag del fluido de la articulacin del dedo del pie. Este anlisis se puede hacer si tiene Scientific laboratory technician. Ayuda a descartar enfermedades que causan la hinchazn dolorosa de las articulaciones, como la artritis o la gota. Cmo se trata? El tratamiento variar segn la gravedad de los sntomas. El objetivo del tratamiento es aliviar los sntomas y Automotive engineer que el [place]. El mdico puede recomendarle lo siguiente: Usar zapatos con puntera ancha o utilizar almohadillas para los juanetes a fin de amortiguar la zona afectada. Vendar los dedos juntos para mantenerlos en una posicin normal. Colocar un dispositivo adentro del zapato (dispositivo ortopdico) para reducir la presin en la articulacin del dedo. Tomar medicamentos para Technical sales engineer inflamacin y Chief Technology Officer. Aplicar hielo o calor sobre la zona afectada. Hacer ejercicios de elongacin. [place], en los [employer]. Siga estas instrucciones en su casa: Control del dolor, la rigidez y la hinchazn     Si se lo indican, aplique hielo sobre la zona dolorida. Para hacer esto: Ponga el hielo en una bolsa plstica. Coloque una toalla entre la piel y Copy. Aplique el hielo durante 20 minutos, 2 o 3 veces por da. Retire el hielo si la piel se pone de color rojo brillante. Esto es Intel. Si no puede sentir dolor, calor o fro, tiene un mayor riesgo de que se dae la zona. Si se lo indican, aplique calor en la zona afectada antes de realizar ejercicios. Use la fuente de calor que el mdico le recomiende, como una compresa de calor hmedo o una almohadilla trmica. Coloque una toalla entre la piel y la fuente de Airline pilot. Aplique calor durante 20 a 30 minutos. Retire la fuente de calor si la piel se pone de color rojo brillante.  Esto es especialmente importante si no puede sentir dolor, calor o fro. Corre un mayor riesgo de sufrir quemaduras. Instrucciones generales [place]aga ejercicio como se lo haya indicado el mdico. Para dar soporte a la articulacin del dedo, use calzado adecuado, plantillas o vendas como se lo haya indicado el mdico. Use los medicamentos de venta libre y los recetados solamente como se lo haya indicado el mdico. No consuma ningn producto que contenga nicotina o tabaco, como cigarrillos, cigarrillos electrnicos y tabaco de Theatre manager. Si necesita ayuda para dejar de fumar, consulte al mdico. Cumpla con todas las visitas de seguimiento. Esto es importante. Comunquese con un mdico si: Sus sntomas empeoran. Los sntomas no mejoran en el trmino de 2 semanas. Solicite ayuda de inmediato si: Tiene mucho dolor o dificultad al caminar. Resumen Un juanete es una protuberancia en la cara interna de la articulacin del dedo gordo que se forma cuando el dedo gordo gira hacia el segundo dedo. Los juanetes pueden causar dolor al [employer]. El tratamiento variar segn la gravedad de los sntomas. Para dar soporte a la articulacin del dedo, use calzado adecuado, plantillas o vendas como se lo haya indicado el mdico. Esta informacin no tiene Theme park manager el consejo del mdico. Asegrese de hacerle al mdico cualquier pregunta que tenga. Document Revised: 10/10/2019 Document Reviewed: 10/10/2019 Elsevier  Patient Education  2023 [employer].

## 2022-09-13 NOTE — Progress Notes (Signed)
Subjective:   Patient ID: Brittany Kelly, female   DOB: 52 y.o.   MRN: [id number]   HPI Chief Complaint  Patient presents with   Foot Pain    1st MPJ right - bunion deformity x years, aching and redness for about a year, shoes uncomfortable   New Patient (Initial Visit)   52 year old female presents the office with above concerns.  She been having tenderness on the area of the bunion that started on the right foot there is no point but a year it is uncomfortable with certain shoes, pressure.  She is also been getting some discomfort reports in the Achilles tendon.  No injuries.  No recent treatment.   Review of Systems  All other systems reviewed and are negative.  Past Medical History:  Diagnosis Date   Allergic rhinitis    PONV (postoperative nausea and vomiting)     Past Surgical History:  Procedure Laterality Date   CESAREAN SECTION  1996   CHOLECYSTECTOMY  10/04/2013   CYST EXCISION Bilateral 1995   under both arms   CYSTOSCOPY N/A 11/21/2013   Procedure: CYSTOSCOPY;  Surgeon: Jacqualin Combes de Gwenevere Ghazi, MD;  Location: [facility];  Service: Gynecology;  Laterality: N/A;   ROBOTIC ASSISTED TOTAL HYSTERECTOMY WITH BILATERAL SALPINGO OOPHERECTOMY Bilateral 11/21/2013   Procedure: ROBOTIC ASSISTED TOTAL HYSTERECTOMY WITH BILATERAL SALPINGO OOPHORECTOMY;  Surgeon: Jacqualin Combes de Gwenevere Ghazi, MD;  Location: [facility];  Service: Gynecology;  Laterality: Bilateral;     Current Outpatient Medications:    meloxicam (MOBIC) 15 MG tablet, Take 1 tablet (15 mg total) by mouth daily as needed for pain., Disp: 30 tablet, Rfl: 0   estradiol (MINIVELLE) 0.05 MG/24HR patch, Place 1 patch (0.05 mg total) onto the skin once a week. Apply patch to skin of the lower abdomen twice a week., Disp: 24 patch, Rfl: 3  No Known Allergies        Objective:  Physical Exam  General: AAO x3, NAD  Dermatological: Skin is warm, dry and supple bilateral.  There are no open  sores, no preulcerative lesions, no rash or signs of infection present.  Vascular: Dorsalis Pedis artery and Posterior Tibial artery pedal pulses are 2/4 bilateral with immedate capillary fill time.  There is no pain with calf compression, swelling, warmth, erythema.   Neruologic: Grossly intact via light touch bilateral.   Musculoskeletal: Mild bunion present on the right foot with tenderness palpation directly over the medial eminence of the first metatarsal head.  No pain or crepitation of the first MPJ range of motion and there is no hypermobility.  Mild tenderness palpation of the Achilles tendon and along the attachment of the calcaneus with palpable spur present.  Clinically the tendon appears to be intact.  No edema, erythema.  MMT 5/5.  Gait: Unassisted, Nonantalgic.       Assessment:   52 year old female with bunion deformity, Achilles tendinitis     Plan:  -Treatment options discussed including all alternatives, risks, and complications -Etiology of symptoms were discussed -X-rays were obtained and reviewed with the patient.  3 views of the right foot were obtained.  Calcaneal spurring is present.  Mild bunion present. -For both issues we discussed shoes, good arch support.  Particular for the bunion we discussed wider softer toebox, wider toebox avoid excess pressure.  Dispensed offloading pad, gel bunion pad.  We discussed the nature of findings and need to be progressive although slowly over time.  For the Achilles we  discussed stretching, icing.  Anti-inflammatories as needed.  Vivi Barrack DPM

## 2022-09-14 ENCOUNTER — Ambulatory Visit: Payer: BC Managed Care – PPO | Admitting: Podiatry

## 2022-11-05 ENCOUNTER — Ambulatory Visit: Payer: BC Managed Care – PPO | Admitting: Obstetrics and Gynecology

## 2023-01-26 NOTE — Progress Notes (Signed)
52 y.o. G69P1001 Married  female here for annual exam.  She needs a refill on her premarin cream.  Difficult to place the cream. Using three times per month.   Interpretor present for the visit.   Intercourse is painful.   Hot flashes are better.   She uses transdermal estrogen and vaginal estrogen cream.   Wants routine blood work.   Has lost about 20 pounds.   Takes Mg and vit C and MVI.   PCP:  No PCP per patient   Patient's last menstrual period was 10/13/2013.           Sexually active: Yes.    The current method of family planning is status post hysterectomy.    Exercising: Yes.     Tennis and gym  Smoker:  no  Health Maintenance: Pap:  10/18/13 neg: HR HPV neg History of abnormal Pap:  no MMG:  04/09/21 Breast Density Cat C, BI-RADS CAT 1 neg Colonoscopy:  n/a BMD:   12/21/16  Result  WNL TDaP:  12/28/18 Gardasil:   no HIV: 2010 neg per pt Hep C: 10/03/15 neg Screening Labs: today.   reports that she has never smoked. She has never used smokeless tobacco. She reports current alcohol use of about 2.0 standard drinks of alcohol per week. She reports that she does not use drugs.  Past Medical History:  Diagnosis Date   Allergic rhinitis    PONV (postoperative nausea and vomiting)     Past Surgical History:  Procedure Laterality Date   CESAREAN SECTION  1996   CHOLECYSTECTOMY  10/04/2013   CYST EXCISION Bilateral 1995   under both arms   CYSTOSCOPY N/A 11/21/2013   Procedure: CYSTOSCOPY;  Surgeon: Jacqualin Combes de Gwenevere Ghazi, MD;  Location: [facility];  Service: Gynecology;  Laterality: N/A;   ROBOTIC ASSISTED TOTAL HYSTERECTOMY WITH BILATERAL SALPINGO OOPHERECTOMY Bilateral 11/21/2013   Procedure: ROBOTIC ASSISTED TOTAL HYSTERECTOMY WITH BILATERAL SALPINGO OOPHORECTOMY;  Surgeon: Jacqualin Combes de Gwenevere Ghazi, MD;  Location: [facility];  Service: Gynecology;  Laterality: Bilateral;    Current Outpatient Medications  Medication Sig Dispense Refill    estradiol (MINIVELLE) 0.05 MG/24HR patch Place 1 patch (0.05 mg total) onto the skin once a week. Apply patch to skin of the lower abdomen twice a week. 24 patch 3   meloxicam (MOBIC) 15 MG tablet Take 1 tablet (15 mg total) by mouth daily as needed for pain. 30 tablet 0   No current facility-administered medications for this visit.    Family History  Problem Relation Age of Onset   Diabetes Mother    Hypertension Mother    Arthritis Mother    Hypertension Father    Arthritis Father    Breast cancer Neg Hx     Review of Systems  All other systems reviewed and are negative.   Exam:   BP 116/72   Pulse 74   Ht 5\' 7"  (1.702 m)   Wt 173 lb (78.5 kg)   LMP 10/13/2013   SpO2 100%   BMI 27.10 kg/m     General appearance: alert, cooperative and appears stated age Head: normocephalic, without obvious abnormality, atraumatic Neck: no adenopathy, supple, symmetrical, trachea midline and thyroid normal to inspection and palpation Lungs: clear to auscultation bilaterally Breasts: normal appearance, no masses or tenderness, No nipple retraction or dimpling, No nipple discharge or bleeding, No axillary adenopathy Heart: regular rate and rhythm Abdomen: soft, non-tender; no masses, no organomegaly Extremities: extremities normal, atraumatic,  no cyanosis or edema Skin: skin color, texture, turgor normal. No rashes or lesions Lymph nodes: cervical, supraclavicular, and axillary nodes normal. Neurologic: grossly normal  Pelvic: External genitalia:  no lesions              No abnormal inguinal nodes palpated.              Urethra:  normal appearing urethra with no masses, tenderness or lesions              Bartholins and Skenes: normal                 Vagina: normal appearing vagina with normal color and discharge, no lesions              Cervix: absent              Pap taken: no Bimanual Exam:  Uterus:  absent              Adnexa: no mass, fullness, tenderness              Rectal exam:  yes.  Confirms.              Anus:  normal sphincter tone, no lesions  Chaperone was present for exam:  Gwenith Spitz, CMA  Assessment:   Well woman visit with gynecologic exam. Status post robotic hysterectomy with BSO.  ERT use and vaginal estrogen use. Dyspareunia.   Plan: Mammogram screening discussed.  She will update as she is overdue.  She received contact information for breast imaging facilities here in [place].  Self breast awareness reviewed. Pap and HR HPV not indicated.  Routine labs. Guidelines for Calcium, Vitamin D, regular exercise program including cardiovascular and weight bearing exercise. Refill Vivelle dot and Vagifem after mammogram back and normal. Follow up annually and prn.

## 2023-02-09 ENCOUNTER — Ambulatory Visit (INDEPENDENT_AMBULATORY_CARE_PROVIDER_SITE_OTHER): Payer: 59 | Admitting: Obstetrics and Gynecology

## 2023-02-09 ENCOUNTER — Encounter: Payer: Self-pay | Admitting: Obstetrics and Gynecology

## 2023-02-09 VITALS — BP 116/72 | HR 74 | Ht 67.0 in | Wt 173.0 lb

## 2023-02-09 DIAGNOSIS — N941 Unspecified dyspareunia: Secondary | ICD-10-CM | POA: Diagnosis not present

## 2023-02-09 DIAGNOSIS — Z0000 Encounter for general adult medical examination without abnormal findings: Secondary | ICD-10-CM

## 2023-02-09 DIAGNOSIS — Z01419 Encounter for gynecological examination (general) (routine) without abnormal findings: Secondary | ICD-10-CM | POA: Diagnosis not present

## 2023-02-09 NOTE — Patient Instructions (Signed)

## 2023-02-10 LAB — URINALYSIS, COMPLETE W/RFL CULTURE
Bacteria, UA: NONE SEEN /HPF
Bilirubin Urine: NEGATIVE
Glucose, UA: NEGATIVE
Hgb urine dipstick: NEGATIVE
Hyaline Cast: NONE SEEN /LPF
Ketones, ur: NEGATIVE
Leukocyte Esterase: NEGATIVE
Nitrites, Initial: NEGATIVE
Protein, ur: NEGATIVE
RBC / HPF: NONE SEEN /HPF (ref 0–2)
Specific Gravity, Urine: 1.013 (ref 1.001–1.035)
Squamous Epithelial / HPF: NONE SEEN /HPF (ref <=5)
WBC, UA: NONE SEEN /HPF (ref 0–5)
pH: 6 (ref 5.0–8.0)

## 2023-02-10 LAB — COMPREHENSIVE METABOLIC PANEL
AG Ratio: 1.5 (calc) (ref 1.0–2.5)
ALT: 12 U/L (ref 6–29)
AST: 18 U/L (ref 10–35)
Albumin: 4.6 g/dL (ref 3.6–5.1)
Alkaline phosphatase (APISO): 80 U/L (ref 37–153)
BUN: 19 mg/dL (ref 7–25)
CO2: 26 mmol/L (ref 20–32)
Calcium: 9.6 mg/dL (ref 8.6–10.4)
Chloride: 101 mmol/L (ref 98–110)
Creat: 0.76 mg/dL (ref 0.50–1.03)
Globulin: 3 g/dL (calc) (ref 1.9–3.7)
Glucose, Bld: 83 mg/dL (ref 65–99)
Potassium: 3.9 mmol/L (ref 3.5–5.3)
Sodium: 137 mmol/L (ref 135–146)
Total Bilirubin: 1 mg/dL (ref 0.2–1.2)
Total Protein: 7.6 g/dL (ref 6.1–8.1)

## 2023-02-10 LAB — VITAMIN D 25 HYDROXY (VIT D DEFICIENCY, FRACTURES): Vit D, 25-Hydroxy: 37 ng/mL (ref 30–100)

## 2023-02-10 LAB — TSH: TSH: 1.26 mIU/L

## 2023-02-10 LAB — LIPID PANEL
Cholesterol: 228 mg/dL — ABNORMAL HIGH (ref <200)
HDL: 73 mg/dL (ref >=50)
LDL Cholesterol (Calc): 135 mg/dL (calc) — ABNORMAL HIGH
Non-HDL Cholesterol (Calc): 155 mg/dL (calc) — ABNORMAL HIGH (ref <130)
Total CHOL/HDL Ratio: 3.1 (calc) (ref <5.0)
Triglycerides: 98 mg/dL (ref <150)

## 2023-02-10 LAB — CBC
HCT: 42.1 % (ref 35.0–45.0)
Hemoglobin: 13.8 g/dL (ref 11.7–15.5)
MCH: 29.3 pg (ref 27.0–33.0)
MCHC: 32.8 g/dL (ref 32.0–36.0)
MCV: 89.4 fL (ref 80.0–100.0)
MPV: 9.8 fL (ref 7.5–12.5)
Platelets: 422 10*3/uL — ABNORMAL HIGH (ref 140–400)
RBC: 4.71 10*6/uL (ref 3.80–5.10)
RDW: 12.6 % (ref 11.0–15.0)
WBC: 7.4 10*3/uL (ref 3.8–10.8)

## 2023-02-10 LAB — NO CULTURE INDICATED

## 2023-02-16 ENCOUNTER — Encounter: Payer: Self-pay | Admitting: Obstetrics and Gynecology

## 2023-02-17 ENCOUNTER — Other Ambulatory Visit: Payer: Self-pay | Admitting: Obstetrics and Gynecology

## 2023-02-17 MED ORDER — ESTRADIOL 0.05 MG/24HR TD PTTW: MEDICATED_PATCH | TRANSDERMAL | 3 refills | Status: DC

## 2023-02-17 MED ORDER — ESTRADIOL 0.05 MG/24HR TD PTTW: 1.0000 | MEDICATED_PATCH | TRANSDERMAL | 3 refills | Status: DC

## 2023-02-17 MED ORDER — ESTRADIOL 10 MCG VA TABS: 1.0000 | ORAL_TABLET | VAGINAL | 3 refills | Status: DC

## 2023-02-17 MED ORDER — PREMARIN 0.625 MG/GM VA CREA: TOPICAL_CREAM | VAGINAL | 2 refills | Status: DC

## 2023-02-17 NOTE — Telephone Encounter (Signed)
Mammo report in care everywhere-performed on 02/11/2023.

## 2023-02-17 NOTE — Progress Notes (Signed)
Order for Minivelle estradiol patch, which is twice weekly.   Order for Premarin vaginal cream.

## 2023-02-17 NOTE — Progress Notes (Signed)
Rx for Premarin cream discontinued.  Rx given for Vagifem tablets.

## 2023-09-27 ENCOUNTER — Ambulatory Visit: Payer: BC Managed Care – PPO | Admitting: Family Medicine

## 2024-01-11 ENCOUNTER — Ambulatory Visit: Admitting: Family Medicine

## 2024-01-11 ENCOUNTER — Encounter: Payer: Self-pay | Admitting: Family Medicine

## 2024-01-11 VITALS — BP 118/80 | HR 65 | Resp 12 | Ht 67.0 in | Wt 186.1 lb

## 2024-01-11 DIAGNOSIS — E785 Hyperlipidemia, unspecified: Secondary | ICD-10-CM | POA: Diagnosis not present

## 2024-01-11 DIAGNOSIS — M545 Low back pain, unspecified: Secondary | ICD-10-CM | POA: Diagnosis not present

## 2024-01-11 DIAGNOSIS — Z1211 Encounter for screening for malignant neoplasm of colon: Secondary | ICD-10-CM

## 2024-01-11 DIAGNOSIS — E663 Overweight: Secondary | ICD-10-CM | POA: Diagnosis not present

## 2024-01-11 DIAGNOSIS — G8929 Other chronic pain: Secondary | ICD-10-CM | POA: Diagnosis not present

## 2024-01-11 DIAGNOSIS — R7989 Other specified abnormal findings of blood chemistry: Secondary | ICD-10-CM | POA: Diagnosis not present

## 2024-01-11 DIAGNOSIS — N62 Hypertrophy of breast: Secondary | ICD-10-CM

## 2024-01-11 DIAGNOSIS — Z789 Other specified health status: Secondary | ICD-10-CM

## 2024-01-11 LAB — COMPREHENSIVE METABOLIC PANEL WITH GFR
ALT: 11 U/L (ref 0–35)
AST: 22 U/L (ref 0–37)
Albumin: 4.6 g/dL (ref 3.5–5.2)
Alkaline Phosphatase: 72 U/L (ref 39–117)
BUN: 12 mg/dL (ref 6–23)
CO2: 28 meq/L (ref 19–32)
Calcium: 9.4 mg/dL (ref 8.4–10.5)
Chloride: 100 meq/L (ref 96–112)
Creatinine, Ser: 0.65 mg/dL (ref 0.40–1.20)
GFR: 99.71 mL/min (ref >60.00)
Glucose, Bld: 93 mg/dL (ref 70–99)
Potassium: 4.4 meq/L (ref 3.5–5.1)
Sodium: 138 meq/L (ref 135–145)
Total Bilirubin: 1.2 mg/dL (ref 0.2–1.2)
Total Protein: 8.3 g/dL (ref 6.0–8.3)

## 2024-01-11 LAB — LIPID PANEL
Cholesterol: 219 mg/dL — ABNORMAL HIGH (ref 0–200)
HDL: 64.1 mg/dL (ref >39.00)
LDL Cholesterol: 133 mg/dL — ABNORMAL HIGH (ref 0–99)
NonHDL: 155.14
Total CHOL/HDL Ratio: 3
Triglycerides: 109 mg/dL (ref 0.0–149.0)
VLDL: 21.8 mg/dL (ref 0.0–40.0)

## 2024-01-11 LAB — C-REACTIVE PROTEIN: CRP: <1 mg/dL (ref 0.5–20.0)

## 2024-01-11 LAB — CBC
HCT: 39.2 % (ref 36.0–46.0)
Hemoglobin: 13.2 g/dL (ref 12.0–15.0)
MCHC: 33.6 g/dL (ref 30.0–36.0)
MCV: 88.4 fl (ref 78.0–100.0)
Platelets: 384 K/uL (ref 150.0–400.0)
RBC: 4.44 Mil/uL (ref 3.87–5.11)
RDW: 13.6 % (ref 11.5–15.5)
WBC: 5.5 K/uL (ref 4.0–10.5)

## 2024-01-11 NOTE — Progress Notes (Signed)
 HPI: [name] is a 53 y.o. female  with PMHx significant for HLD, hydronephrosis, who is here today with her husband to establish care.  Former PCP: Dr. Pura  Last preventive routine visit: 12/2021.  Exercise: Playing tennis 3-4 days/week and going to the gym 1-2x/week.  Diet: Home cooked meals; eats outside the home about 1x/week.   Smoking: Never. Alcohol consumption: Socially; drinking 2-3 glasses of wine on weekends.   Vision: UTD and almost due for routine vision exam. Last seen last year. Recently moved and is in the process of re-establishing in the area.   Chronic medical problems:   Hyperlipidemia Not currently on pharmacologic treatment.  Following a low fat diet. Lab Results  Component Value Date   CHOL 228 (H) 02/09/2023   HDL 73 02/09/2023   LDLCALC 135 (H) 02/09/2023   TRIG 98 02/09/2023   CHOLHDL 3.1 02/09/2023   Reports hx of endometriosis, s/p hysterectomy. Currently on estradiol  patch.  Follows up with gynecology annually.   Chronic Back Pain She complains of mid-lower back pain/discomfort, ongoing 2 years.  Rates her pain a 6-7/10 on days she exercises.  She attributes this to her breasts growing in size for the past couple years as she has gained weight.  She endorses her breasts feeling heavier than they previously have.  Exercising aggravates back pain. She has been wearing a faja/girdle while exercising or playing tennis to limit potential back pain.  She is managing her pain with Meloxicam  and Tylenol .   She obtained this medication from a past visit with podiatry for toe pain.  No known trauma.  She has not had any imaging done. Has not done PT in the past.   Denies any personal hx of diabetes, depression, anxiety, seasonal allergies, or rectal bleeding. She reports a fmhx of diabetes and hypertension (father). No fmhx of colon cancer.  She has not had colon cancer screening yet.  -She reports struggling with her  weight loss, despite best efforts with healthy diet and regular exercise.  She would like to consider pharmacologic treatments.  Review of Systems  Constitutional:  Negative for activity change, appetite change and fever.  Respiratory:  Negative for cough, shortness of breath and wheezing.   Cardiovascular:  Negative for chest pain, palpitations and leg swelling.  Gastrointestinal:  Negative for abdominal pain, nausea and vomiting.  Endocrine: Negative for cold intolerance, heat intolerance, polydipsia, polyphagia and polyuria.  Genitourinary:  Negative for decreased urine volume, dysuria and hematuria.  Musculoskeletal:  Positive for back pain. Negative for gait problem.  Skin:  Negative for rash.  Allergic/Immunologic: Positive for environmental allergies.  Neurological:  Negative for syncope, weakness and headaches.  Psychiatric/Behavioral:  Negative for confusion and hallucinations.   See other pertinent positives and negatives in HPI.  Current Outpatient Medications on File Prior to Visit  Medication Sig Dispense Refill   Estradiol  (VAGIFEM ) 10 MCG TABS vaginal tablet Place 1 tablet (10 mcg total) vaginally 2 (two) times a week. Use every night before bed for two weeks when you first begin this medicine, then after the first two weeks, begin using it twice a week. 34 tablet 3   estradiol  (MINIVELLE ) 0.05 MG/24HR patch Place one patch (0.05 mg) to skin twice weekly. 24 patch 3   No current facility-administered medications on file prior to visit.   Past Medical History:  Diagnosis Date   Allergic rhinitis    PONV (postoperative nausea and vomiting)    No Known Allergies  Family History  Problem Relation Age of Onset   Diabetes Mother    Hypertension Mother    Arthritis Mother    Hypertension Father    Arthritis Father    Breast cancer Neg Hx     Social History   Socioeconomic History   Marital status: Married    Spouse name: Not on file   Number of children: 1    Years of education: Not on file   Highest education level: Not on file  Occupational History   Not on file  Tobacco Use   Smoking status: Never   Smokeless tobacco: Never  Vaping Use   Vaping status: Never Used  Substance and Sexual Activity   Alcohol use: Yes    Alcohol/week: 2.0 standard drinks of alcohol    Types: 2 Standard drinks or equivalent per week   Drug use: No   Sexual activity: Yes    Partners: Male    Birth control/protection: Surgical    Comment: R-TAH/BSO  Other Topics Concern   Not on file  Social History Narrative   Married to [employer]. 1 child.    Some college. Art therapist.   Drinks caffeine.    Wears her seatbelt.   Exercises > 3x week.    Guns in the home in a locked cabinet.    Feels safe in her relationships.    Social Drivers of Corporate investment banker Strain: Low Risk  (01/13/2022)   Received from [facility]   Overall Financial Resource Strain (CARDIA)    Difficulty of Paying Living Expenses: Not hard at all  Food Insecurity: No Food Insecurity (01/08/2021)   Received from [facility]   Hunger Vital Sign    Worried About Running Out of Food in the Last Year: Never true    Ran Out of Food in the Last Year: Never true  Transportation Needs: Patient Declined (01/07/2024)   PRAPARE - Transportation    Lack of Transportation (Medical): Patient declined    Lack of Transportation (Non-Medical): Patient declined  Physical Activity: Sufficiently Active (01/07/2024)   Exercise Vital Sign    Days of Exercise per Week: 5 days    Minutes of Exercise per Session: 50 min  Stress: No Stress Concern Present (01/07/2024)   [employer] of Occupational Health - Occupational Stress Questionnaire    Feeling of Stress: Not at all  Social Connections: Unknown (01/07/2024)   Social Connection and Isolation Panel    Frequency of Communication with Friends and Family: Once a week    Frequency of Social Gatherings with Friends and Family:  Once a week    Attends Religious Services: Patient declined    Database administrator or Organizations: Not on file    Attends Banker Meetings: Not on file    Marital Status: Married   Today's Vitals   01/11/24 1015  BP: 118/80  Pulse: 65  Resp: 12  SpO2: 97%  Weight: 186 lb 2 oz (84.4 kg)  Height: 5' 7 (1.702 m)   Body mass index is 29.15 kg/m.  Physical Exam Vitals and nursing note reviewed.  Constitutional:      General: She is not in acute distress.    Appearance: She is well-developed.  HENT:     Head: Normocephalic and atraumatic.     Mouth/Throat:     Mouth: Mucous membranes are moist.     Pharynx: Oropharynx is clear.  Eyes:     Conjunctiva/sclera: Conjunctivae normal.  Cardiovascular:  Rate and Rhythm: Normal rate and regular rhythm.     Pulses:          Dorsalis pedis pulses are 2+ on the right side and 2+ on the left side.     Heart sounds: No murmur heard. Pulmonary:     Effort: Pulmonary effort is normal. No respiratory distress.     Breath sounds: Normal breath sounds.  Abdominal:     Palpations: Abdomen is soft. There is no hepatomegaly or mass.     Tenderness: There is no abdominal tenderness.  Musculoskeletal:     Thoracic back: No tenderness.     Lumbar back: Tenderness present. Negative right straight leg raise test and negative left straight leg raise test.  Lymphadenopathy:     Cervical: No cervical adenopathy.  Skin:    General: Skin is warm.     Findings: No erythema or rash.  Neurological:     General: No focal deficit present.     Mental Status: She is alert and oriented to person, place, and time.     Gait: Gait normal.  Psychiatric:        Mood and Affect: Mood and affect normal.   ASSESSMENT AND PLAN: Ms. Brittany Kelly was seen today to establish care.  Orders Placed This Encounter  Procedures   DG Lumbar Spine Complete   Comprehensive metabolic panel with GFR   C-reactive protein   CBC   Lipid  panel   Ambulatory referral to Plastic Surgery   Amb Ref to Medical Weight Management   Lab Results  Component Value Date   NA 138 01/11/2024   CL 100 01/11/2024   K 4.4 01/11/2024   CO2 28 01/11/2024   BUN 12 01/11/2024   CREATININE 0.65 01/11/2024   GFR 99.71 01/11/2024   CALCIUM 9.4 01/11/2024   ALBUMIN 4.6 01/11/2024   GLUCOSE 93 01/11/2024   Lab Results  Component Value Date   ALT 11 01/11/2024   AST 22 01/11/2024   ALKPHOS 72 01/11/2024   BILITOT 1.2 01/11/2024   Lab Results  Component Value Date   WBC 5.5 01/11/2024   HGB 13.2 01/11/2024   HCT 39.2 01/11/2024   MCV 88.4 01/11/2024   PLT 384.0 01/11/2024   Lab Results  Component Value Date   CRP <1.0 01/11/2024   Chronic bilateral low back pain without sciatica Assessment & Plan: She attributes problem to increased in breast size. Has not had imaging in the past, lumbar X ray to be done this coming Friday when we have X ray service. Try to avoid activities that may aggravate problem. Continue OTC topical products like icy hot patches.  Orders: -     C-reactive protein; Future -     CBC; Future -     DG Lumbar Spine Complete; Future  Hyperlipidemia, unspecified hyperlipidemia type Assessment & Plan: Currently on non pharmacologic treatment. Further recommendations according to FLP result.  Orders: -     Comprehensive metabolic panel with GFR; Future -     Lipid panel; Future  Overweight (BMI 25.0-29.9) Assessment & Plan: Consistency with healthy diet and physical activity encouraged. She is interested in a referral to the [facility] for a consultation, referral placed.  Orders: -     Amb Ref to Medical Weight Management  Elevated platelet count Mild and intermittent.  -     CBC; Future  Breast hypertrophy in female She would like consultation with plastic surgeons. No tenderness or masses and  up to date with her routine mammograms.  -     Ambulatory referral to  Plastic Surgery  Colon cancer screening -     Ambulatory referral to Gastroenterology   Return in about 1 year (around 01/10/2025) for CPE.  I, Vernell Forest, acting as a scribe for Sherrine Salberg [place], MD., have documented all relevant documentation on the behalf of Brittany Kelly [place], MD, as directed by   while in the presence of Brittany Kelly [place], MD.  I, Greer Wainright [place], MD, have reviewed all documentation for this visit. The documentation on 01/11/24 for the exam, diagnosis, procedures, and orders are all accurate and complete.  Brittany Kelly G. [place], MD  [facility]

## 2024-01-11 NOTE — Patient Instructions (Addendum)
 A few things to remember from today's visit:  Hyperlipidemia, unspecified hyperlipidemia type - Plan: Comprehensive metabolic panel with GFR, Lipid panel  Chronic bilateral low back pain without sciatica - Plan: C-reactive protein, CBC, DG Lumbar Spine Complete  Overweight (BMI 25.0-29.9) - Plan: Amb Ref to Medical Weight Management  Elevated platelet count - Plan: CBC  Breast hypertrophy in female - Plan: Ambulatory referral to Plastic Surgery  Empiece un app para escribir lo que come.

## 2024-01-12 ENCOUNTER — Ambulatory Visit: Admitting: Family Medicine

## 2024-01-14 ENCOUNTER — Ambulatory Visit: Payer: Self-pay | Admitting: Family Medicine

## 2024-01-14 ENCOUNTER — Ambulatory Visit (INDEPENDENT_AMBULATORY_CARE_PROVIDER_SITE_OTHER)

## 2024-01-14 ENCOUNTER — Other Ambulatory Visit

## 2024-01-14 DIAGNOSIS — M545 Low back pain, unspecified: Secondary | ICD-10-CM

## 2024-01-14 DIAGNOSIS — G8929 Other chronic pain: Secondary | ICD-10-CM

## 2024-01-14 NOTE — Assessment & Plan Note (Signed)
 Currently on non pharmacologic treatment. Further recommendations according to FLP result.

## 2024-01-14 NOTE — Assessment & Plan Note (Signed)
 She attributes problem to increased in breast size. Has not had imaging in the past, lumbar X ray to be done this coming Friday when we have X ray service. Try to avoid activities that may aggravate problem. Continue OTC topical products like icy hot patches.

## 2024-01-14 NOTE — Assessment & Plan Note (Signed)
 Consistency with healthy diet and physical activity encouraged. She is interested in a referral to the [facility] for a consultation, referral placed.

## 2024-01-25 ENCOUNTER — Ambulatory Visit (INDEPENDENT_AMBULATORY_CARE_PROVIDER_SITE_OTHER)

## 2024-01-25 VITALS — BP 119/77 | HR 80 | Ht 68.0 in | Wt 186.2 lb

## 2024-01-25 DIAGNOSIS — M542 Cervicalgia: Secondary | ICD-10-CM

## 2024-01-25 DIAGNOSIS — R21 Rash and other nonspecific skin eruption: Secondary | ICD-10-CM | POA: Diagnosis not present

## 2024-01-25 DIAGNOSIS — M549 Dorsalgia, unspecified: Secondary | ICD-10-CM

## 2024-01-25 DIAGNOSIS — N62 Hypertrophy of breast: Secondary | ICD-10-CM

## 2024-01-25 NOTE — Progress Notes (Signed)
 BREAST REDUCTION CONSULT Plastic & Reconstructive Surgery New Patient Visit  Patient: Brittany Kelly MRN: [id number] Date: 01/25/2024  Chief Complaint: Symptomatic macromastia, cervicalgia  History of Present Illness:  This is a 53 y.o. woman with PMH and PSH as described below who presents in consultation for breast reduction.   The patient states she has been considering a breast reduction for years. She describes intermittent skin irritation in the breast folds and occasional rashes.   Back pain in the upper and lower back, including neck pain. She pulls or pins her bra straps to provide better lift and relief of the pressure and pain. She notices relief by holding her breast up manually.  Her shoulder straps cause grooves and pain and pressure that requires padding for relief. Pain medication is sometimes required with motrin  and tylenol .  Activities that are hindered by enlarged breasts include: exercise and running.  She has tried supportive clothing as well as fitted bras without improvement.  She currently wears a D cup bra and would ideally like to be a A-B cup.   She has no personal history of breast abnormalities and has never had any breast biopsies or surgeries.  Her last mammogram was last year and was normal.   She no recent weight changes. Of note, she is done child bearing.   Past Medical History: Past Medical History:  Diagnosis Date   Allergic rhinitis    GERD (gastroesophageal reflux disease)    Hyperlipidemia    PONV (postoperative nausea and vomiting)    Past Surgical History: Past Surgical History:  Procedure Laterality Date   ABDOMINAL HYSTERECTOMY  11/30/2014   CESAREAN SECTION  1996   CHOLECYSTECTOMY  10/04/2013   CYST EXCISION Bilateral 1995   under both arms   CYSTOSCOPY N/A 11/21/2013   Procedure: CYSTOSCOPY;  Surgeon: Bobie FORBES Crown de Charly[place] FORBES Cary, MD;  Location: [facility];  Service: Gynecology;  Laterality: N/A;   ROBOTIC ASSISTED  TOTAL HYSTERECTOMY WITH BILATERAL SALPINGO OOPHERECTOMY Bilateral 11/21/2013   Procedure: ROBOTIC ASSISTED TOTAL HYSTERECTOMY WITH BILATERAL SALPINGO OOPHORECTOMY;  Surgeon: Bobie FORBES Crown de Ch[place]nn FORBES Cary, MD;  Location: [facility];  Service: Gynecology;  Laterality: Bilateral;   Current Medications: Current Outpatient Medications on File Prior to Visit  Medication Sig Dispense Refill   estradiol  (MINIVELLE ) 0.05 MG/24HR patch Place one patch (0.05 mg) to skin twice weekly. 24 patch 3   No current facility-administered medications on file prior to visit.   Allergies: No Known Allergies  Family History:  History of breast cancer in family is negative. Otherwise, family history is negative for bleeding/clotting disorders, problems with anesthesia, connective tissue disorders.   Social History:  Social History   Socioeconomic History   Marital status: Married    Spouse name: Not on file   Number of children: 1   Years of education: Not on file   Highest education level: Not on file  Occupational History   Not on file  Tobacco Use   Smoking status: Never   Smokeless tobacco: Never  Vaping Use   Vaping status: Never Used  Substance and Sexual Activity   Alcohol use: Yes    Alcohol/week: 2.0 standard drinks of alcohol    Types: 2 Standard drinks or equivalent per week   Drug use: No   Sexual activity: Yes    Partners: Male    Birth control/protection: Surgical    Comment: R-TAH/BSO  Other Topics Concern   Not on file  Social History Narrative  Married to [employer]. 1 child.    Some college. Art therapist.   Drinks caffeine.    Wears her seatbelt.   Exercises > 3x week.    Guns in the home in a locked cabinet.    Feels safe in her relationships.    Social Drivers of Corporate investment banker Strain: Low Risk  (01/13/2022)   Received from [facility]   Overall Financial Resource Strain (CARDIA)    Difficulty of Paying Living Expenses: Not hard at  all  Food Insecurity: No Food Insecurity (01/08/2021)   Received from [facility]   Hunger Vital Sign    Within the past 12 months, you worried that your food would run out before you got the money to buy more.: Never true    Within the past 12 months, the food you bought just didn't last and you didn't have money to get more.: Never true  Transportation Needs: Patient Declined (01/07/2024)   PRAPARE - Transportation    Lack of Transportation (Medical): Patient declined    Lack of Transportation (Non-Medical): Patient declined  Physical Activity: Sufficiently Active (01/07/2024)   Exercise Vital Sign    Days of Exercise per Week: 5 days    Minutes of Exercise per Session: 50 min  Stress: No Stress Concern Present (01/07/2024)   [employer] of Occupational Health - Occupational Stress Questionnaire    Feeling of Stress: Not at all  Social Connections: Unknown (01/07/2024)   Social Connection and Isolation Panel    Frequency of Communication with Friends and Family: Once a week    Frequency of Social Gatherings with Friends and Family: Once a week    Attends Religious Services: Patient declined    Database administrator or Organizations: Not on file    Attends Banker Meetings: Not on file    Marital Status: Married   Smoker: Denies Recreational drug use: Denies  Review of systems: 10 point review of systems performed and negative except as noted in the HPI  Physical Exam: BP 119/77 (BP Location: Left Arm, Patient Position: Sitting, Cuff Size: Large)   Pulse 80   Ht 5' 8 (1.727 m)   Wt 186 lb 3.2 oz (84.5 kg)   LMP 10/13/2013   SpO2 95%   BMI 28.31 kg/m  Body mass index is 28.31 kg/m.  Physical Exam           General: Well appearing, no apparent distress. Chest: Chest wall without abnormality or obvious deformity. No scoliosis, pectus excavatum or pectus carinatum. Breast: Bilateral macromastia. Grade 3 ptosis bilaterally. Breast parenchyma is fatty.  There are no palpable breast masses in either breast. Bilateral NAC viable and sensate. Papules everted without discharge. NACs positioned along breast meridian bilaterally. Skin quality is poor. There are no skin lesions, scars or dimpling.  - Breast measurements:  Measurements  Right Breast (cm)  Left Breast (cm)  SN-Nipple 27 28  Base Width 15 16  Nipple - IMF 13 12  NAC diameter 5*6 6*7  Internipple Distance 25  Neuro: Moving all four extremities spontaneously.  Psych: Appropriate mood and affect.   Pertinent Labs: Recent Results (from the past 2160 hours)  Lipid panel     Status: Abnormal   Collection Time: 01/11/24 11:24 AM  Result Value Ref Range   Cholesterol 219 (H) 0 - 200 mg/dL    Comment: ATP III Classification       Desirable:  < 200 mg/dL  Borderline High:  200 - 239 mg/dL          High:  > = 759 mg/dL   Triglycerides 890.9 0.0 - 149.0 mg/dL    Comment: Normal:  <849 mg/dLBorderline High:  150 - 199 mg/dL   HDL 35.89 >60.99 mg/dL   VLDL 78.1 0.0 - 59.9 mg/dL   LDL Cholesterol 866 (H) 0 - 99 mg/dL   Total CHOL/HDL Ratio 3     Comment:                Men          Women1/2 Average Risk     3.4          3.3Average Risk          5.0          4.42X Average Risk          9.6          7.13X Average Risk          15.0          11.0                       NonHDL 155.14     Comment: NOTE:  Non-HDL goal should be 30 mg/dL higher than patient's LDL goal (i.e. LDL goal of < 70 mg/dL, would have non-HDL goal of < 100 mg/dL)  CBC     Status: None   Collection Time: 01/11/24 11:24 AM  Result Value Ref Range   WBC 5.5 4.0 - 10.5 K/uL   RBC 4.44 3.87 - 5.11 Mil/uL   Platelets 384.0 150.0 - 400.0 K/uL   Hemoglobin 13.2 12.0 - 15.0 g/dL   HCT 60.7 63.9 - 53.9 %   MCV 88.4 78.0 - 100.0 fl   MCHC 33.6 30.0 - 36.0 g/dL   RDW 86.3 88.4 - 84.4 %  C-reactive protein     Status: None   Collection Time: 01/11/24 11:24 AM  Result Value Ref Range   CRP <1.0 0.5 - 20.0 mg/dL   Comprehensive metabolic panel with GFR     Status: None   Collection Time: 01/11/24 11:24 AM  Result Value Ref Range   Sodium 138 135 - 145 mEq/L   Potassium 4.4 3.5 - 5.1 mEq/L   Chloride 100 96 - 112 mEq/L   CO2 28 19 - 32 mEq/L   Glucose, Bld 93 70 - 99 mg/dL   BUN 12 6 - 23 mg/dL   Creatinine, Ser 9.34 0.40 - 1.20 mg/dL   Total Bilirubin 1.2 0.2 - 1.2 mg/dL   Alkaline Phosphatase 72 39 - 117 U/L   AST 22 0 - 37 U/L   ALT 11 0 - 35 U/L   Total Protein 8.3 6.0 - 8.3 g/dL   Albumin 4.6 3.5 - 5.2 g/dL   GFR 00.28 >39.99 mL/min    Comment: Calculated using the CKD-EPI Creatinine Equation (2021)   Calcium 9.4 8.4 - 10.5 mg/dL    Pertinent Imaging:  Assessment: In summary, this is a pleasant 53 y.o. year-old woman presenting for consultation for bilateral breast reduction in setting of symptomatic macromastia.   @APSEC @   After evaluation today, I believe the patient would be an appropriate candidate for the operation. Based on her Schnur scale, she would require a 575 g reduction per side. I think a 575 g resection would be attainable through a reduction mammoplasty. We discussed the operation in detail including the  potential incision patterns (e.g., vertical only versus wise pattern), and possible need for surgical drains. Based on her clinical exam, she will likely require a wise pattern with inferior pedicle.   We discussed the risks of this procedure which include but are not limited to: bleeding, infection, seroma, delayed wound healing, wound dehiscence, asymmetries, fat necrosis, hypertrophic and keloid scarring, decreased or loss of nipple sensation, partial or full loss of the NAC, numbness, paresthesias, injuries to arteries/nerves/veins, need for revision procedures, further out-of-pocket expenses for ongoing medical care, and potential need for repeat reduction in the future. We discussed that pregnancy can alter the size and shape of her breasts and revision procedures may  be required after pregnancy. We discussed that while she has the same potential to breast feed as a woman who has never had a breast reduction, she may have less milk production and may require formula supplementation. We further discussed the risk of DVT/PE, fat embolism, heart attack, stroke, death as well as the risks of anesthesia. We reviewed the expected recovery period with no heavy activities or lifting >5lbs for 6 weeks postoperatively.   The patient voiced understanding and wishes to proceed. All questions and concerns were addressed to the patient's apparent satisfaction.   Plan: - Plan for bilateral reduction mammaplasty with liposuction - 4 hours under general anesthesia. - Will submit for pre-determination with insurance. - Scheduling pending insurance. - Patient needs mammogram before surgery. The sensitive parts of the examination/procedure were performed with RN as chaperone.  The time documented represents the total time spent on the day of the encounter in preparing for and completing the visit. It does not include time spent by ancillary staff, a resident, a fellow, another trainee, or, for shared visits, time spent jointly with the patient or discussing the case or the performance of other separately performed services.  Time spent: 45 minutes.     Marshell Rieger, MD [facility]  01/25/2024 2:38 PM

## 2024-01-31 ENCOUNTER — Telehealth: Payer: Self-pay

## 2024-01-31 NOTE — Telephone Encounter (Signed)
 Patient called wanting to know when she can schedule

## 2024-02-01 ENCOUNTER — Encounter (INDEPENDENT_AMBULATORY_CARE_PROVIDER_SITE_OTHER): Payer: Self-pay

## 2024-02-14 ENCOUNTER — Ambulatory Visit
Admission: RE | Admit: 2024-02-14 | Discharge: 2024-02-14 | Disposition: A | Discharge status: Home / Self Care | Source: Ambulatory Visit

## 2024-02-14 ENCOUNTER — Ambulatory Visit: Payer: Self-pay | Admitting: Obstetrics and Gynecology

## 2024-02-14 ENCOUNTER — Other Ambulatory Visit (HOSPITAL_COMMUNITY)
Admission: RE | Admit: 2024-02-14 | Discharge: 2024-02-14 | Disposition: A | Discharge status: Home / Self Care | Source: Ambulatory Visit | Attending: Obstetrics and Gynecology | Admitting: Obstetrics and Gynecology

## 2024-02-14 ENCOUNTER — Encounter: Payer: Self-pay | Admitting: Obstetrics and Gynecology

## 2024-02-14 ENCOUNTER — Ambulatory Visit (INDEPENDENT_AMBULATORY_CARE_PROVIDER_SITE_OTHER): Payer: 59 | Admitting: Obstetrics and Gynecology

## 2024-02-14 VITALS — BP 112/80 | HR 76 | Ht 64.5 in | Wt 183.0 lb

## 2024-02-14 DIAGNOSIS — Z79818 Long term (current) use of other agents affecting estrogen receptors and estrogen levels: Secondary | ICD-10-CM

## 2024-02-14 DIAGNOSIS — M542 Cervicalgia: Secondary | ICD-10-CM

## 2024-02-14 DIAGNOSIS — Z01419 Encounter for gynecological examination (general) (routine) without abnormal findings: Secondary | ICD-10-CM

## 2024-02-14 DIAGNOSIS — N898 Other specified noninflammatory disorders of vagina: Secondary | ICD-10-CM

## 2024-02-14 DIAGNOSIS — Z1331 Encounter for screening for depression: Secondary | ICD-10-CM | POA: Diagnosis not present

## 2024-02-14 DIAGNOSIS — R300 Dysuria: Secondary | ICD-10-CM | POA: Diagnosis not present

## 2024-02-14 DIAGNOSIS — N62 Hypertrophy of breast: Secondary | ICD-10-CM

## 2024-02-14 DIAGNOSIS — R21 Rash and other nonspecific skin eruption: Secondary | ICD-10-CM

## 2024-02-14 LAB — URINALYSIS, COMPLETE W/RFL CULTURE
Bacteria, UA: NONE SEEN /HPF
Bilirubin Urine: NEGATIVE
Glucose, UA: NEGATIVE
Hyaline Cast: NONE SEEN /LPF
Ketones, ur: NEGATIVE
Leukocyte Esterase: NEGATIVE
Nitrites, Initial: NEGATIVE
Protein, ur: NEGATIVE
RBC / HPF: NONE SEEN /HPF (ref 0–2)
Specific Gravity, Urine: 1.015 (ref 1.001–1.035)
WBC, UA: NONE SEEN /HPF (ref 0–5)
pH: 5.5 (ref 5.0–8.0)

## 2024-02-14 LAB — NO CULTURE INDICATED

## 2024-02-14 MED ORDER — ESTRADIOL 0.05 MG/24HR TD PTTW: MEDICATED_PATCH | TRANSDERMAL | 3 refills | Status: AC

## 2024-02-14 MED ORDER — ESTRADIOL 0.1 MG/GM VA CREA: TOPICAL_CREAM | VAGINAL | 2 refills | Status: AC

## 2024-02-14 NOTE — Patient Instructions (Addendum)
 El calcio en los alimentos Calcium in Foods El calcio es un mineral del organismo. [employer] del cuerpo, el calcio es el que predomina. La mayor parte del suministro de calcio del cuerpo se almacena en los huesos y los dientes. El calcio ayuda a muchas partes del cuerpo a funcionar, como las siguientes: La sangre y los vasos sanguneos. Los nervios. Las hormonas. Los msculos. Los [employer] y los dientes. Cuando las reservas de calcio son [place], [place] correr riesgo de tener poca masa sea, prdida sea y [place] rotos. Obtener una cantidad suficiente de calcio ayuda a [employer] y los dientes fuertes a lo largo de la vida. El calcio es importante especialmente para: Los nios [employer] picos de crecimiento. Las Forensic scientist. Las mujeres embarazadas o que estn [place]. Las mujeres despus de que se detiene el ciclo menstrual (posmenopusicas). Las mujeres cuyo ciclo menstrual se detiene debido a un trastorno alimentario o a la actividad fsica intensa y regular. Las personas que no pueden comer ni digerir productos lcteos. Las personas que siguen una alimentacin vegana. Cantidades de calcio diarias recomendadas: Mujeres (entre 19 y 42 aos): 1000 mg por Futures trader. Mujeres (a partir de 51 aos): 1200 mg por Futures trader. Hombres (entre 19 y 31 aos): 1000 mg por da. Hombres (a partir de 17 aos): 1200 mg por da. Mujeres (entre 9 y 85 aos): 1300 mg por Futures trader. Hombres (entre 9 y 61 aos): 1300 mg por Futures trader. Informacin general Consuma alimentos ricos en calcio. Intente obtener la mayor parte del calcio a travs de los alimentos. Algunas personas pueden beneficiarse de tomar suplementos de calcio. Consulte al mdico o a un profesional en alimentacin saludable, denominado nutricionista, antes de empezar a tomar suplementos de calcio. Los suplementos de calcio pueden interactuar con determinados medicamentos. Demasiado calcio puede ocasionar otros problemas de  salud, como problemas para defecar y clculos renales. Para que el cuerpo absorba el calcio, necesita vitamina D. Las fuentes de vitamina D incluyen: Exposicin directa de la piel a la luz del sol. Alimentos, como yema de [place], [place], [place], pescados de agua salada y [place] fortificada. Suplementos de vitamina D. Consulte al mdico o al nutricionista antes de empezar a tomar un suplemento de vitamina D. La cantidad de calcio que absorbe el organismo depende del tipo de alimento. Hable con un nutricionista sobre qu alimentos son los mejores para usted, sobre todo si tiene una alimentacin vegana o no consume lcteos. Qu alimentos son ricos en calcio?  Los alimentos ricos en calcio contienen ms de 100 miligramos por porcin. Frutas Jugo de naranja u otra fruta fortificado, 300 mg por porcin de 8 onzas (237 ml). Verduras Col berza, 260 mg por porcin de 1 taza (130 g), cocida. Col rizada, 180 mg por porcin de 1 taza (118 g), cocida. Bok choy, 180 mg por porcin de 1 taza (170 g), cocida. Cereales Waffles fortificados congelados, 200 mg en 2 waffles. Avena, 180 mg por porcin de 1 taza (234 g), cocida. Pan blanco enriquecido, 175 mg por rebanada. Carnes y otras protenas [place]ardinas con espinas en lata, 350 mg por porcin de 3.75 onzas (92 g). Salmn con espinas en lata, 168 mg por porcin de 3 onzas (85 g). Camarones en lata, 125 mg por porcin de 3 onzas (85 g). Frijoles cocidos, 120 mg por porcin de 1 taza (266 g). Tofu firme y hecho con sulfato de calcio, 861 mg por porcin de  taza (126 g). Lcteos Yogur natural bajo en grasas,  448 mg por porcin de 1 taza (245 g). Leche descremada, 300 mg por porcin de 1 taza (245 g). Queso americano, 145 mg por porcin de 1 onza (21 g) o 1 feta. Queso cheddar, 200 mg por porcin de 1 onza (28 g) o 1 feta. Queso cottage 2%, 125 mg por porcin de  taza (113 g). Leche enriquecida de soja, arroz o almendras, 300 mg por porcin de 1 taza (237  ml). Mozzarella parcialmente descremada, 210 mg por porcin de 1 onza (21 g). Es posible que los productos enumerados anteriormente no sean la lista completa de los alimentos ricos en calcio. Las cantidades reales de calcio pueden ser diferentes segn el procesamiento. Consulte a un nutricionista para obtener ms informacin. Qu alimentos tienen bajo contenido de calcio? Los alimentos bajos en calcio contienen 50 mg de calcio o menos por porcin. Frutas Manzana, 1 mediana, aproximadamente 6 mg. Banana, 1 mediana, aproximadamente 12 mg. Verduras Lechuga, 19 mg por porcin de 1 taza (35 g). Tomate, 1 pequeo, aproximadamente 11 mg. Cereales Arroz blanco, 8 mg por porcin de  taza (79 g). Papas cocidas, 14 mg por porcin de 1 taza (160 g). Pan blanco, 6 mg por rebanada. Carnes y otras protenas Huevo, 24 mg por 1 huevo (50 g). Carne roja, 7 mg por porcin de 4 onzas (80 mg). Pollo, 17 mg por porcin de 4 onzas (113 g). Pescado, bacalao o trucha, 20 mg por porcin de 4 onzas (140 g). Lcteos Queso crema normal, 14 mg por porcin de 1 cucharada (15 g). Queso Brie, 50 mg por porcin de 1 onza (32 mg). Es posible que los productos enumerados anteriormente no sean la lista completa de los alimentos con bajo contenido de calcio. Las cantidades reales de calcio pueden ser diferentes segn el procesamiento. Consulte a un nutricionista para obtener ms informacin. Esta informacin no tiene Theme park manager el consejo del mdico. Asegrese de hacerle al mdico cualquier pregunta que tenga. Document Revised: 02/18/2023 Document Reviewed: 02/18/2023 Elsevier Patient Education  2024 Elsevier Inc.  EXERCISE AND DIET:  We recommended that you start or continue a regular exercise program for good health. Regular exercise means any activity that makes your heart beat faster and makes you sweat.  We recommend exercising at least 30 minutes per day at least 3 days a week, preferably 4 or 5.  We also  recommend a diet low in fat and sugar.  Inactivity, poor dietary choices and obesity can cause diabetes, heart attack, stroke, and kidney damage, among others.    ALCOHOL AND SMOKING:  Women should limit their alcohol intake to no more than 7 drinks/beers/glasses of wine (combined, not each!) per week. Moderation of alcohol intake to this level decreases your risk of breast cancer and liver damage. And of course, no recreational drugs are part of a healthy lifestyle.  And absolutely no smoking or even second hand smoke. Most people know smoking can cause heart and lung diseases, but did you know it also contributes to weakening of your bones? Aging of your skin?  Yellowing of your teeth and nails?  CALCIUM AND VITAMIN D :  Adequate intake of calcium and Vitamin D  are recommended.  The recommendations for exact amounts of these supplements seem to change often, but generally speaking 600 mg of calcium (either carbonate or citrate) and 800 units of Vitamin D  per day seems prudent. Certain women may benefit from higher intake of Vitamin D .  If you are among these women, your doctor will have told  you during your visit.    PAP SMEARS:  Pap smears, to check for cervical cancer or precancers,  have traditionally been done yearly, although recent scientific advances have shown that most women can have pap smears less often.  However, every woman still should have a physical exam from her gynecologist every year. It will include a breast check, inspection of the vulva and vagina to check for abnormal growths or skin changes, a visual exam of the cervix, and then an exam to evaluate the size and shape of the uterus and ovaries.  And after 54 years of age, a rectal exam is indicated to check for rectal cancers. We will also provide age appropriate advice regarding health maintenance, like when you should have certain vaccines, screening for sexually transmitted diseases, bone density testing, colonoscopy, mammograms,  etc.   MAMMOGRAMS:  All women over 92 years old should have a yearly mammogram. Many facilities now offer a 3D mammogram, which may cost around $50 extra out of pocket. If possible,  we recommend you accept the option to have the 3D mammogram performed.  It both reduces the number of women who will be called back for extra views which then turn out to be normal, and it is better than the routine mammogram at detecting truly abnormal areas.    COLONOSCOPY:  Colonoscopy to screen for colon cancer is recommended for all women at age 60.  We know, you hate the idea of the prep.  We agree, BUT, having colon cancer and not knowing it is worse!!  Colon cancer so often starts as a polyp that can be seen and removed at colonscopy, which can quite literally save your life!  And if your first colonoscopy is normal and you have no family history of colon cancer, most women don't have to have it again for 10 years.  Once every ten years, you can do something that may end up saving your life, right?  We will be happy to help you get it scheduled when you are ready.  Be sure to check your insurance coverage so you understand how much it will cost.  It may be covered as a preventative service at no cost, but you should check your particular policy.

## 2024-02-14 NOTE — Progress Notes (Signed)
 53 y.o. G3P1001 [name] here for annual exam. Having some been having a burning sensation with urination and on outside of vagina, vaginal irritation, pain/pressure for about 1 week.    Interpretor present for the visit today.   Some normal vaginal discharge, no itching. Has vaginal irritation.   Using vaginal estrogen once a month.    Takes vit B12, omega fatty acids, Mg.  PCP: [place], Betty G, MD   Patient's last menstrual period was 10/13/2013.           Sexually active: Yes.    The current method of family planning is status post hysterectomy.    Menopausal hormone therapy:  Minivelle   Exercising: Yes.    Tennis 3-4x week and gym  Smoker:  no  OB History  Gravida Para Term Preterm AB Living  1 1 1   1   SAB IAB Ectopic Multiple Live Births          # Outcome Date GA Lbr Len/2nd Weight Sex Type Anes PTL Lv  1 Term              HEALTH MAINTENANCE: Last 2 paps:  10/18/13 neg HR HPV neg History of abnormal Pap or positive HPV:  no Mammogram:   02/14/24 [facility], 02/11/23 [facility] Cat 2 benign (care everywhere).  Has appointment today.  Colonoscopy:  never , PCP is going to schedule this year.   Bone Density:  12/21/16  Result  normal    Immunization History  Administered Date(s) Administered   PFIZER(Purple Top)SARS-COV-2 Vaccination 08/11/2019, 09/01/2019, 07/15/2020, 10/11/2020   Tdap 12/28/2018      reports that she has never smoked. She has never used smokeless tobacco. She reports current alcohol use of about 2.0 standard drinks of alcohol per week. She reports that she does not use drugs.  Past Medical History:  Diagnosis Date   Allergic rhinitis    GERD (gastroesophageal reflux disease)    Hyperlipidemia    PONV (postoperative nausea and vomiting)     Past Surgical History:  Procedure Laterality Date   ABDOMINAL HYSTERECTOMY  11/30/2014   CESAREAN SECTION  1996   CHOLECYSTECTOMY  10/04/2013   CYST EXCISION Bilateral 1995   under  both arms   CYSTOSCOPY N/A 11/21/2013   Procedure: CYSTOSCOPY;  Surgeon: Bobie FORBES Crown de Charlynn FORBES Cary, MD;  Location: [facility];  Service: Gynecology;  Laterality: N/A;   ROBOTIC ASSISTED TOTAL HYSTERECTOMY WITH BILATERAL SALPINGO OOPHERECTOMY Bilateral 11/21/2013   Procedure: ROBOTIC ASSISTED TOTAL HYSTERECTOMY WITH BILATERAL SALPINGO OOPHORECTOMY;  Surgeon: Bobie FORBES Crown de Charlynn FORBES Cary, MD;  Location: [facility];  Service: Gynecology;  Laterality: Bilateral;    Current Outpatient Medications  Medication Sig Dispense Refill   estradiol  (MINIVELLE ) 0.05 MG/24HR patch Place one patch (0.05 mg) to skin twice weekly. 24 patch 3   No current facility-administered medications for this visit.    ALLERGIES: Patient has no known allergies.  Family History  Problem Relation Age of Onset   Diabetes Mother    Hypertension Mother    Arthritis Mother    Hypertension Father    Arthritis Father    Breast cancer Neg Hx    Cancer Neg Hx     Review of Systems  Genitourinary:  Positive for vaginal discharge and vaginal pain.  All other systems reviewed and are negative.   PHYSICAL EXAM:  BP 112/80 (BP Location: Left Arm, Patient Position: Sitting)   Pulse 76   Ht 5' 4.5 (1.638  m)   Wt 183 lb (83 kg)   LMP 10/13/2013   SpO2 97%   BMI 30.93 kg/m     General appearance: alert, cooperative and appears stated age Head: normocephalic, without obvious abnormality, atraumatic Neck: no adenopathy, supple, symmetrical, trachea midline and thyroid  normal to inspection and palpation Lungs: clear to auscultation bilaterally Breasts: normal appearance, no masses or tenderness, No nipple retraction or dimpling, No nipple discharge or bleeding, No axillary adenopathy Heart: regular rate and rhythm Abdomen: soft, non-tender; no masses, no organomegaly Extremities: extremities normal, atraumatic, no cyanosis or edema Skin: skin color, texture, turgor normal. No rashes or lesions Lymph nodes:  cervical, supraclavicular, and axillary nodes normal. Neurologic: grossly normal  Pelvic: External genitalia:  no lesions              No abnormal inguinal nodes palpated.              Urethra:  normal appearing urethra with no masses, tenderness or lesions              Bartholins and Skenes: normal                 Vagina: normal appearing vagina with normal color and discharge, no lesions              Cervix: absent              Pap taken: no Bimanual Exam:  Uterus: absent.              Adnexa: no mass, fullness, tenderness              Rectal exam: yes.  Confirms.              Anus:  normal sphincter tone, no lesions  Chaperone was present for exam:  Kari HERO, CMA  ASSESSMENT: Well woman visit with gynecologic exam. Status post robotic hysterectomy with BSO.  ERT use and vaginal estrogen use. Dysuria. Vaginal irritation.   PLAN: Mammogram screening discussed. Self breast awareness reviewed. Pap and HRV collected:  no.  Not indicated.  Guidelines for Calcium, Vitamin D , regular exercise program including cardiovascular and weight bearing exercise. Discused WH[facility] and use of ERT which can increase risk of PE, DVT, stroke.  Benefits of treating vasomotor symptoms and reducing risk of osteoporosis also reviewed. Medication refills:  Minivelle  Dot 0.05 mg twice weekly.  #24, RF 3.  Estradiol  cream.  Urinalysis:  negative.  No UC needed.  Vaginitis swab.  I recommend vaginal estradiol  cream to control vaginal atrophy. Labs done with PCP. Follow up:  yearly and prn.

## 2024-02-15 LAB — CERVICOVAGINAL ANCILLARY ONLY
Bacterial Vaginitis (gardnerella): NEGATIVE
Candida Glabrata: NEGATIVE
Candida Vaginitis: NEGATIVE
Comment: NEGATIVE
Comment: NEGATIVE
Comment: NEGATIVE
Comment: NEGATIVE
Trichomonas: NEGATIVE

## 2024-02-17 ENCOUNTER — Other Ambulatory Visit: Payer: Self-pay

## 2024-02-17 DIAGNOSIS — R928 Other abnormal and inconclusive findings on diagnostic imaging of breast: Secondary | ICD-10-CM

## 2024-02-22 ENCOUNTER — Encounter: Payer: Self-pay | Admitting: Surgical

## 2024-02-22 ENCOUNTER — Ambulatory Visit (INDEPENDENT_AMBULATORY_CARE_PROVIDER_SITE_OTHER): Admitting: Surgical

## 2024-02-22 VITALS — BP 131/86 | HR 71 | Ht 67.0 in | Wt 181.8 lb

## 2024-02-22 DIAGNOSIS — N62 Hypertrophy of breast: Secondary | ICD-10-CM

## 2024-02-22 DIAGNOSIS — M542 Cervicalgia: Secondary | ICD-10-CM

## 2024-02-22 DIAGNOSIS — R21 Rash and other nonspecific skin eruption: Secondary | ICD-10-CM

## 2024-02-22 NOTE — Progress Notes (Signed)
 Patient ID: Brittany Kelly, female    DOB: Nov 06, 1970, 53 y.o.   MRN: [id number]  Chief Complaint  Patient presents with   Pre-op Exam      ICD-10-CM   1. Macromastia  N62     2. Rash  R21     3. Cervical pain  M54.2       History of Present Illness: Brittany Kelly is a 53 y.o.  female  with a history of macromastia.  She presents for preoperative evaluation for upcoming procedure, Bilateral Breast Reduction with possible liposuction, scheduled for 03/21/2024 with Dr.  Montorfano  The patient has not had problems with anesthesia.  Summary of Previous Visit: Patient currently wears a D cup bra and would like to be a-B cup.  Has no personal history of breast abnormalities, has not had any breast biopsies or surgery. STN 27 cm on the right, 28 cm on the left.  NAC diameter on the right 5.6, 6.7 on the left.  Patient would likely need an inferior pedicle, Wise pattern.  Estimated excess breast tissue to be removed at time of surgery: 575 grams  Patient is doing well.  No recent changes to her health.  She presents with her husband and a Spanish interpreter for today's appointment.  Patient and her husband both understand English quite well and speak English, but interpreter was present through the entire preoperative appointment.  PMH Significant for: GERD, hyperlipidemia, postoperative nausea and vomiting.  Patient has a history of abdominal hysterectomy, cholecystectomy, and a few other surgical procedures.  She has tolerated this well.  She does have a history of bilateral salpingo-oophorectomy and is on replacement estrogen patches.  She denies any history of cardiac or pulmonary disease.  She reports that she is overall healthy.  We did thoroughly review the consent form today, patient and her husband read through the consent form with assistance of Spanish interpreter prior to appointment.   Past Medical History: Allergies: No Known Allergies  Current  Medications:  Current Outpatient Medications:    estradiol  (ESTRACE ) 0.1 MG/GM vaginal cream, Use 1/2 g vaginally every night for the first 2 weeks, then use 1/2 g vaginally two or three times per week as needed to maintain symptom relief., Disp: 42.5 g, Rfl: 2   estradiol  (MINIVELLE ) 0.05 MG/24HR patch, Place one patch (0.05 mg) to skin twice weekly., Disp: 24 patch, Rfl: 3  Past Medical Problems: Past Medical History:  Diagnosis Date   Allergic rhinitis    GERD (gastroesophageal reflux disease)    Hyperlipidemia    PONV (postoperative nausea and vomiting)     Past Surgical History: Past Surgical History:  Procedure Laterality Date   ABDOMINAL HYSTERECTOMY  11/30/2014   CESAREAN SECTION  1996   CHOLECYSTECTOMY  10/04/2013   CYST EXCISION Bilateral 1995   under both arms   CYSTOSCOPY N/A 11/21/2013   Procedure: CYSTOSCOPY;  Surgeon: Bobie FORBES Crown de Charlynn FORBES Cary, MD;  Location: [facility];  Service: Gynecology;  Laterality: N/A;   ROBOTIC ASSISTED TOTAL HYSTERECTOMY WITH BILATERAL SALPINGO OOPHERECTOMY Bilateral 11/21/2013   Procedure: ROBOTIC ASSISTED TOTAL HYSTERECTOMY WITH BILATERAL SALPINGO OOPHORECTOMY;  Surgeon: Bobie FORBES Crown de Charlynn FORBES Cary, MD;  Location: [facility];  Service: Gynecology;  Laterality: Bilateral;    Social History: Social History   Socioeconomic History   Marital status: Married    Spouse name: Not on file   Number of children: 1   Years of education: Not on file  Highest education level: Not on file  Occupational History   Not on file  Tobacco Use   Smoking status: Never   Smokeless tobacco: Never  Vaping Use   Vaping status: Never Used  Substance and Sexual Activity   Alcohol use: Yes    Alcohol/week: 2.0 standard drinks of alcohol    Types: 2 Standard drinks or equivalent per week   Drug use: No   Sexual activity: Yes    Partners: Male    Birth control/protection: Surgical    Comment: R-TAH/BSO  Other Topics Concern   Not on  file  Social History Narrative   Married to [employer]. 1 child.    Some college. Art therapist.   Drinks caffeine.    Wears her seatbelt.   Exercises > 3x week.    Guns in the home in a locked cabinet.    Feels safe in her relationships.    Social Drivers of Corporate investment banker Strain: Low Risk  (01/13/2022)   Received from [facility]   Overall Financial Resource Strain (CARDIA)    Difficulty of Paying Living Expenses: Not hard at all  Food Insecurity: No Food Insecurity (01/08/2021)   Received from [facility]   Hunger Vital Sign    Within the past 12 months, you worried that your food would run out before you got the money to buy more.: Never true    Within the past 12 months, the food you bought just didn't last and you didn't have money to get more.: Never true  Transportation Needs: Patient Declined (01/07/2024)   PRAPARE - Transportation    Lack of Transportation (Medical): Patient declined    Lack of Transportation (Non-Medical): Patient declined  Physical Activity: Sufficiently Active (01/07/2024)   Exercise Vital Sign    Days of Exercise per Week: 5 days    Minutes of Exercise per Session: 50 min  Stress: No Stress Concern Present (01/07/2024)   [employer] of Occupational Health - Occupational Stress Questionnaire    Feeling of Stress: Not at all  Social Connections: Unknown (01/07/2024)   Social Connection and Isolation Panel    Frequency of Communication with Friends and Family: Once a week    Frequency of Social Gatherings with Friends and Family: Once a week    Attends Religious Services: Patient declined    Database administrator or Organizations: Not on file    Attends Banker Meetings: Not on file    Marital Status: Married  Intimate Partner Violence: Not At Risk (06/29/2023)   Received from [facility]   HITS    Over the last 12 months how often did your partner physically hurt you?: Never    Over the last 12  months how often did your partner insult you or talk down to you?: Never    Over the last 12 months how often did your partner threaten you with physical harm?: Never    Over the last 12 months how often did your partner scream or curse at you?: Never    Family History: Family History  Problem Relation Age of Onset   Diabetes Mother    Hypertension Mother    Arthritis Mother    Hypertension Father    Arthritis Father    Cancer Neg Hx    Breast cancer Neg Hx     Review of Systems: Review of Systems  Constitutional: Negative.   Cardiovascular: Negative.   Gastrointestinal: Negative.   Genitourinary: Negative.  Neurological: Negative.     Physical Exam: Vital Signs BP 131/86 (BP Location: Left Arm, Patient Position: Sitting, Cuff Size: Normal)   Pulse 71   Ht 5' 7 (1.702 m)   Wt 181 lb 12.8 oz (82.5 kg)   LMP 10/13/2013   SpO2 94%   BMI 28.47 kg/m   Physical Exam Constitutional:      General: Not in acute distress.    Appearance: Normal appearance. Not ill-appearing.  HENT:     Head: Normocephalic and atraumatic.  Eyes:     Pupils: Pupils are equal, round Neck:     Musculoskeletal: Normal range of motion.  Cardiovascular:     Rate and Rhythm: Normal rate    Pulses: Normal pulses.  Pulmonary:     Effort: Pulmonary effort is normal. No respiratory distress.  Musculoskeletal: Normal range of motion.  Skin:    General: Skin is warm and dry.     Findings: No erythema or rash.  Neurological:     General: No focal deficit present.     Mental Status: Alert and oriented to person, place, and time. Mental status is at baseline.     Motor: No weakness.  Psychiatric:        Mood and Affect: Mood normal.        Behavior: Behavior normal.    Assessment/Plan: The patient is scheduled for bilateral breast reduction with Dr. Montorfano.  Risks, benefits, and alternatives of procedure discussed, questions answered and consent obtained.    Smoking Status: Non-smoker;  Counseling Given?  N/A Last Mammogram: Patient had a mammogram on 02/14/2024.  Imaging was incomplete, BI-RADS Category 0.  Needs additional imaging.  She is scheduled for 3D mammogram and possible ultrasound tomorrow.; Results: No results available  Caprini Score: 5; Risk Factors include: Age, on estrogen patches, BMI > 25, and length of planned surgery. Recommendation for mechanical prophylaxis. Encourage early ambulation.   Pictures obtained: @consult   Post-op Rx sent to pharmacy: Oxycodone , Zofran   Patient was provided with the breast reduction and General Surgical Risk consent document and Pain Medication Agreement prior to their appointment.  They had adequate time to read through the risk consent documents and Pain Medication Agreement. We also discussed them in person together during this preop appointment. All of their questions were answered to their satisfaction.  Recommended calling if they have any further questions.  Risk consent form and Pain Medication Agreement to be scanned into patient's chart.  The risk that can be encountered with breast reduction were discussed and include the following but not limited to these:  Breast asymmetry, fluid accumulation, firmness of the breast, inability to breast feed, loss of nipple or areola, skin loss, decrease or no nipple sensation, fat necrosis of the breast tissue, bleeding, infection, healing delay.  There are risks of anesthesia, changes to skin sensation and injury to nerves or blood vessels.  The muscle can be temporarily or permanently injured.  You may have an allergic reaction to tape, suture, glue, blood products which can result in skin discoloration, swelling, pain, skin lesions, poor healing.  Any of these can lead to the need for revisonal surgery or stage procedures.  A reduction has potential to interfere with diagnostic procedures.  Nipple or breast piercing can increase risks of infection.  This procedure is best done when the  breast is fully developed.  Changes in the breast will continue to occur over time.  Pregnancy can alter the outcomes of previous breast reduction surgery, weight gain and  weigh loss can also effect the long term appearance.   Patient is medically optimized from a plastic surgery standpoint and is cleared to proceed with surgery from a plastic surgery stance  Electronically signed by: Donnice PARAS Shin Lamour, PA-C 02/22/2024 11:12 AM

## 2024-02-23 ENCOUNTER — Other Ambulatory Visit: Payer: Self-pay

## 2024-02-23 ENCOUNTER — Ambulatory Visit: Admission: RE | Admit: 2024-02-23 | Discharge: 2024-02-23 | Disposition: A | Source: Ambulatory Visit

## 2024-02-23 DIAGNOSIS — R928 Other abnormal and inconclusive findings on diagnostic imaging of breast: Secondary | ICD-10-CM

## 2024-02-23 DIAGNOSIS — R921 Mammographic calcification found on diagnostic imaging of breast: Secondary | ICD-10-CM

## 2024-02-23 DIAGNOSIS — N6320 Unspecified lump in the left breast, unspecified quadrant: Secondary | ICD-10-CM

## 2024-02-24 NOTE — Progress Notes (Signed)
 Patient was seen for pre-op for breast reduction, had plans for addtl breast imaging due to MMG on 02/14/24 being incomplete and needing further imaging. She has addtl imaging yesterday 02/23/24.   Patients imaging showed BIRADS 4, suspicious: Stereotactic guided biopsy of 0.6 cm mass of the upper outer quadrant of the LEFT breast.  Dr. Montorfano aware

## 2024-02-28 ENCOUNTER — Ambulatory Visit
Admission: RE | Admit: 2024-02-28 | Discharge: 2024-02-28 | Disposition: A | Discharge status: Home / Self Care | Source: Ambulatory Visit

## 2024-02-28 DIAGNOSIS — N6320 Unspecified lump in the left breast, unspecified quadrant: Secondary | ICD-10-CM

## 2024-02-28 HISTORY — PX: BREAST BIOPSY: SHX20

## 2024-02-29 LAB — SURGICAL PATHOLOGY

## 2024-03-21 ENCOUNTER — Other Ambulatory Visit: Payer: Self-pay

## 2024-03-21 DIAGNOSIS — Z719 Counseling, unspecified: Secondary | ICD-10-CM

## 2024-03-21 MED ORDER — GABAPENTIN 300 MG PO CAPS: 300.0000 mg | ORAL_CAPSULE | Freq: Two times a day (BID) | ORAL | 0 refills | Status: DC

## 2024-03-21 MED ORDER — IBUPROFEN 600 MG PO TABS: 600.0000 mg | ORAL_TABLET | Freq: Three times a day (TID) | ORAL | 0 refills | Status: DC | PRN

## 2024-03-21 MED ORDER — ONDANSETRON 4 MG PO TBDP: 4.0000 mg | ORAL_TABLET | Freq: Three times a day (TID) | ORAL | 0 refills | Status: DC | PRN

## 2024-03-21 MED ORDER — ACETAMINOPHEN 500 MG PO TABS: 500.0000 mg | ORAL_TABLET | Freq: Four times a day (QID) | ORAL | 0 refills | Status: AC | PRN

## 2024-03-21 MED ORDER — OXYCODONE HCL 5 MG PO TABS: 5.0000 mg | ORAL_TABLET | ORAL | 0 refills | Status: AC | PRN

## 2024-03-21 MED ORDER — CYCLOBENZAPRINE HCL 5 MG PO TABS: 5.0000 mg | ORAL_TABLET | Freq: Three times a day (TID) | ORAL | 1 refills | Status: AC | PRN

## 2024-03-21 NOTE — Progress Notes (Signed)
 Breast biopsy showed fibrocystic changes with usual ductal hyperplasia and metaplasia. Patient cleared to proceed with breast reduction with liposuction. Postoperative medications ordered and sent to pharmacy of patients preference.   Brittany Gilberti M. Woodruff Skirvin, MD [facility]

## 2024-03-22 ENCOUNTER — Encounter

## 2024-03-23 LAB — SURGICAL PATHOLOGY

## 2024-03-24 ENCOUNTER — Other Ambulatory Visit: Payer: Self-pay

## 2024-03-24 ENCOUNTER — Telehealth: Payer: Self-pay

## 2024-03-24 NOTE — Telephone Encounter (Signed)
 Returned husband's call in regards to when patient can take a shower.  Brittany Kelly is to wait until her first PO visit before taking a shower. May take a bird bath to clean herself. Reminded next POV is 03/29/2024

## 2024-03-29 ENCOUNTER — Ambulatory Visit (INDEPENDENT_AMBULATORY_CARE_PROVIDER_SITE_OTHER): Payer: Self-pay

## 2024-03-29 VITALS — BP 115/75 | HR 78

## 2024-03-29 DIAGNOSIS — Z9889 Other specified postprocedural states: Secondary | ICD-10-CM

## 2024-03-29 DIAGNOSIS — Z09 Encounter for follow-up examination after completed treatment for conditions other than malignant neoplasm: Secondary | ICD-10-CM

## 2024-03-29 MED ORDER — GABAPENTIN 300 MG PO CAPS: 300.0000 mg | ORAL_CAPSULE | Freq: Two times a day (BID) | ORAL | 0 refills | Status: DC

## 2024-03-29 NOTE — Progress Notes (Signed)
   Established Patient Office Visit  Subjective   Patient ID: Arianis Bowditch, female    DOB: [birth year]  Age: 53 y.o. MRN: [id number]  Chief Complaint  Patient presents with   Post-op Follow-up    HPI  S/p bilateral breast reduction/mastopexy and liposuction of bilateral lateral breasts. Patient doing well no issues, pain has been under control. No hematomas or seromas. Steri-strips in place.  Comes accompanied by husband    Objective:     BP 115/75 (BP Location: Left Arm, Patient Position: Sitting, Cuff Size: Large)   Pulse 78   LMP 10/13/2013   SpO2 99%  BP Readings from Last 3 Encounters:  03/29/24 115/75  02/22/24 131/86  02/14/24 112/80      Physical Exam MA as chaperone Alert and oriented x 3 Breathing without difficulty Hemodynamically normal Bilateral breasts and lateral chest with Steri-Strips in place, some bruising but without signs of hematomas. Nipple areolar complex well-perfused.  Last CBC Lab Results  Component Value Date   WBC 5.5 01/11/2024   HGB 13.2 01/11/2024   HCT 39.2 01/11/2024   MCV 88.4 01/11/2024   MCH 29.3 02/09/2023   RDW 13.6 01/11/2024   PLT 384.0 01/11/2024   Last metabolic panel Lab Results  Component Value Date   GLUCOSE 93 01/11/2024   NA 138 01/11/2024   K 4.4 01/11/2024   CL 100 01/11/2024   CO2 28 01/11/2024   BUN 12 01/11/2024   CREATININE 0.65 01/11/2024   GFR 99.71 01/11/2024   CALCIUM 9.4 01/11/2024   PROT 8.3 01/11/2024   ALBUMIN 4.6 01/11/2024   LABGLOB 3.1 11/03/2019   AGRATIO 1.5 11/03/2019   BILITOT 1.2 01/11/2024   ALKPHOS 72 01/11/2024   AST 22 01/11/2024   ALT 11 01/11/2024   ANIONGAP 5 09/21/2015       Assessment & Plan:   Problem List Items Addressed This Visit   None Visit Diagnoses       Follow-up exam    -  Primary     S/P bilateral breast reduction           - Okay to shower - Surgical pathology with benign tissue, no cancer identified. - No heavy lifting for 6  weeks - Keep Steri-Strips in place and trim them as they start the attaching from the skin. - All questions answered to patient and husband satisfaction.  - Follow-up with me in 1 month or earlier if any issues or concerns.   Carleah Yablonski M Dama Hedgepeth, MD

## 2024-04-02 ENCOUNTER — Other Ambulatory Visit: Payer: Self-pay

## 2024-04-03 ENCOUNTER — Ambulatory Visit (INDEPENDENT_AMBULATORY_CARE_PROVIDER_SITE_OTHER): Payer: Self-pay

## 2024-04-03 VITALS — BP 116/82 | HR 76

## 2024-04-03 DIAGNOSIS — Z09 Encounter for follow-up examination after completed treatment for conditions other than malignant neoplasm: Secondary | ICD-10-CM

## 2024-04-03 DIAGNOSIS — Z9889 Other specified postprocedural states: Secondary | ICD-10-CM

## 2024-04-03 NOTE — Progress Notes (Signed)
   Established Patient Office Visit  Subjective   Patient ID: Brittany Kelly, female    DOB: [birth year]  Age: 53 y.o. MRN: [id number]  Chief Complaint  Patient presents with   postop    postop:bilateral breast reduction with liposuction    HPI S/p bilateral breast reduction/mastopexy and liposuction of bilateral lateral breasts. Patient doing well no issues, pain has been under control.  Having some burning sensation at the incision sites. No hematomas or seromas. Steri-strips in place.  Comes accompanied by husband   Objective:     LMP 10/13/2013  BP Readings from Last 3 Encounters:  04/03/24 116/82  03/29/24 115/75  02/22/24 131/86      Physical Exam MA as chaperone Alert and oriented x 3 Breathing without difficulty Hemodynamically normal Bilateral breasts and lateral chest with Steri-Strips in place, some bruising but without signs of hematomas. Nipple areolar complex well-perfused.  No results found for any visits on 04/03/24.  Last CBC Lab Results  Component Value Date   WBC 5.5 01/11/2024   HGB 13.2 01/11/2024   HCT 39.2 01/11/2024   MCV 88.4 01/11/2024   MCH 29.3 02/09/2023   RDW 13.6 01/11/2024   PLT 384.0 01/11/2024   Last metabolic panel Lab Results  Component Value Date   GLUCOSE 93 01/11/2024   NA 138 01/11/2024   K 4.4 01/11/2024   CL 100 01/11/2024   CO2 28 01/11/2024   BUN 12 01/11/2024   CREATININE 0.65 01/11/2024   GFR 99.71 01/11/2024   CALCIUM 9.4 01/11/2024   PROT 8.3 01/11/2024   ALBUMIN 4.6 01/11/2024   LABGLOB 3.1 11/03/2019   AGRATIO 1.5 11/03/2019   BILITOT 1.2 01/11/2024   ALKPHOS 72 01/11/2024   AST 22 01/11/2024   ALT 11 01/11/2024   ANIONGAP 5 09/21/2015      The 10-year ASCVD risk score (Arnett DK, et al., 2019) is: 1.4%    Assessment & Plan:   Problem List Items Addressed This Visit   None Visit Diagnoses       Follow-up exam    -  Primary      - Okay to shower - Surgical pathology with  benign tissue, no cancer identified. - No heavy lifting for 6 weeks - Keep Steri-Strips in place and trim them as they start the attaching from the skin. - The point stitches removed today. - All questions answered to patient and husband satisfaction.  - Follow-up with me in 1 month or earlier if any issues or concerns.   Wyvonne Carda M. Zaydee Aina, MD [facility]

## 2024-04-04 ENCOUNTER — Encounter: Admitting: Surgical

## 2024-04-07 ENCOUNTER — Encounter: Admitting: Surgical

## 2024-04-11 ENCOUNTER — Encounter: Admitting: Surgical

## 2024-04-19 ENCOUNTER — Ambulatory Visit

## 2024-04-19 VITALS — BP 113/74 | HR 84

## 2024-04-19 DIAGNOSIS — R222 Localized swelling, mass and lump, trunk: Secondary | ICD-10-CM

## 2024-04-19 DIAGNOSIS — Z9889 Other specified postprocedural states: Secondary | ICD-10-CM

## 2024-04-19 DIAGNOSIS — Z09 Encounter for follow-up examination after completed treatment for conditions other than malignant neoplasm: Secondary | ICD-10-CM

## 2024-04-19 DIAGNOSIS — N62 Hypertrophy of breast: Secondary | ICD-10-CM

## 2024-04-19 MED ORDER — DOXYCYCLINE HYCLATE 100 MG PO TABS: 100.0000 mg | ORAL_TABLET | Freq: Two times a day (BID) | ORAL | 0 refills | Status: AC

## 2024-04-19 NOTE — Progress Notes (Signed)
   Established Patient Office Visit  Subjective   Patient ID: Brittany Kelly, female    DOB: 07-29-1970  Age: 53 y.o. MRN: [id number]  Chief Complaint  Patient presents with   Post-op Follow-up    HPI S/p bilateral breast reduction/mastopexy and liposuction of bilateral lateral breasts. Patient has noticed some mild erythema and serous fluid from right breast, pain has been under control.  Having some burning sensation at the incision sites, which is improving. No hematomas or seromas palpated.  Comes accompanied by husband    Objective:     BP 113/74 (BP Location: Left Arm, Patient Position: Sitting, Cuff Size: Normal)   Pulse 84   LMP 10/13/2013   SpO2 98%     Physical Exam MA as chaperone Alert and oriented x 3 Breathing without difficulty Hemodynamically normal Bilateral breasts with scaly skin and mild lateral erythema, some bruising but without signs of hematomas. Nipple areolar complex well-perfused. Back right mole/mass 2 x 1 cm       No results found for any visits on 04/19/24.  Last CBC Lab Results  Component Value Date   WBC 5.5 01/11/2024   HGB 13.2 01/11/2024   HCT 39.2 01/11/2024   MCV 88.4 01/11/2024   MCH 29.3 02/09/2023   RDW 13.6 01/11/2024   PLT 384.0 01/11/2024   Last metabolic panel Lab Results  Component Value Date   GLUCOSE 93 01/11/2024   NA 138 01/11/2024   K 4.4 01/11/2024   CL 100 01/11/2024   CO2 28 01/11/2024   BUN 12 01/11/2024   CREATININE 0.65 01/11/2024   GFR 99.71 01/11/2024   CALCIUM 9.4 01/11/2024   PROT 8.3 01/11/2024   ALBUMIN 4.6 01/11/2024   LABGLOB 3.1 11/03/2019   AGRATIO 1.5 11/03/2019   BILITOT 1.2 01/11/2024   ALKPHOS 72 01/11/2024   AST 22 01/11/2024   ALT 11 01/11/2024   ANIONGAP 5 09/21/2015      The 10-year ASCVD risk score (Arnett DK, et al., 2019) is: 1.3%    Assessment & Plan:   Problem List Items Addressed This Visit   None Visit Diagnoses       Macromastia    -   Primary     Follow-up exam         S/P bilateral breast reduction          - Okay to shower - No heavy lifting for 6 weeks - Doxycycline for 7 days due to mild lateral erythema of breasts.  - We will schedule patient for back mole resection in office. - All questions answered to patient and husband satisfaction.  - Follow-up with me as scheduled or earlier if any issues or concerns.  Brittany Delcid M Koston Hennes, MD

## 2024-04-27 ENCOUNTER — Encounter

## 2024-04-27 ENCOUNTER — Encounter: Admitting: Physician Assistant

## 2024-04-28 ENCOUNTER — Encounter

## 2024-04-28 ENCOUNTER — Ambulatory Visit

## 2024-04-28 VITALS — BP 119/80 | HR 77

## 2024-04-28 DIAGNOSIS — Z09 Encounter for follow-up examination after completed treatment for conditions other than malignant neoplasm: Secondary | ICD-10-CM

## 2024-04-28 DIAGNOSIS — Z719 Counseling, unspecified: Secondary | ICD-10-CM

## 2024-04-28 DIAGNOSIS — Z9889 Other specified postprocedural states: Secondary | ICD-10-CM

## 2024-04-28 DIAGNOSIS — D229 Melanocytic nevi, unspecified: Secondary | ICD-10-CM

## 2024-04-28 NOTE — Progress Notes (Signed)
° °  Established Patient Office Visit  Subjective   Patient ID: Chrisie Jankovich, female    DOB: [birth year]  Age: 53 y.o. MRN: [id number]  Chief Complaint  Patient presents with   post op    HPI  S/p bilateral breast reduction/mastopexy and liposuction of bilateral lateral breasts. Patient doing well no issues, pain has been under control. Comes accompanied by husband. Patient has a mole in her back that would like removed.    Objective:     BP 119/80 (BP Location: Left Arm, Patient Position: Sitting, Cuff Size: Large)   Pulse 77   LMP 10/13/2013   SpO2 98%    Physical Exam  MA as chaperone Alert and oriented x 3 Breathing without difficulty Hemodynamically normal Bilateral breasts and lateral chest incisions clean dry intact. Nipple areolar complex well-perfused. Irregular shape mole in her back measuring 2 cm x 1 cm     Assessment & Plan:   Problem List Items Addressed This Visit   None Visit Diagnoses       S/P bilateral breast reduction    -  Primary     Follow-up exam         Atypical mole           - Start scar cream today  - Removal of atypical mole in procedure room TBD.  - All questions answered to patient and husband satisfaction.  - Follow-up with me in 1 month or earlier if any issues or concerns.   Dovey Fatzinger M Jakala Herford, MD

## 2024-06-23 ENCOUNTER — Ambulatory Visit

## 2024-06-23 VITALS — BP 121/77 | HR 83

## 2024-06-23 DIAGNOSIS — Z9889 Other specified postprocedural states: Secondary | ICD-10-CM

## 2024-06-23 DIAGNOSIS — Z09 Encounter for follow-up examination after completed treatment for conditions other than malignant neoplasm: Secondary | ICD-10-CM

## 2024-06-23 NOTE — Progress Notes (Signed)
" ° °  Established Patient Office Visit  Subjective   Patient ID: Brittany Kelly, female    DOB: 05-10-1971  Age: 54 y.o. MRN: [id number]  Chief Complaint  Patient presents with   post op    HPI  56 F s/p bilateral breast reduction/mastopexy and liposuction of bilateral lateral breasts. Has completely healed the incisions. No hematoma or seroma. Expressed she is very happy with the outcome.    ROS    Objective:     BP 121/77 (BP Location: Right Arm, Patient Position: Sitting, Cuff Size: Normal)   Pulse 83   LMP 10/13/2013   SpO2 97%    Physical Exam MA as chaperone Alert and oriented x 3 Breathing without difficulty Hemodynamically normal Bilateral breasts incisions completely healed.. Nipple areolar complex well-perfused. Back right mole/mass removed by dermatologist. Small biopsy scar noticed.   The 10-year ASCVD risk score (Arnett DK, et al., 2019) is: 1.7%    Assessment & Plan:   Problem List Items Addressed This Visit   None Visit Diagnoses       S/P bilateral breast reduction    -  Primary     Follow-up exam          Ok to resume sports.  Ok to submerge incisions.  All questions answered to patient satisfaction.  Patient will book [facility] laser next month.   Brittany Mcginn M Henri Baumler, MD   "

## 2024-08-03 ENCOUNTER — Other Ambulatory Visit

## 2025-02-19 ENCOUNTER — Ambulatory Visit: Admitting: Obstetrics and Gynecology
